# Patient Record
Sex: Female | Born: 1955
Health system: Southern US, Community
[De-identification: ages and names within clinical notes are randomized; demographics above are authoritative.]

## PROBLEM LIST (undated history)

## (undated) HISTORY — PX: TONSILLECTOMY: SUR1361

---
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
## 2002-12-31 ENCOUNTER — Encounter: Admission: RE | Admit: 2002-12-31 | Discharge: 2002-12-31 | Payer: Self-pay | Admitting: Family Medicine

## 2004-05-21 ENCOUNTER — Other Ambulatory Visit: Admission: RE | Admit: 2004-05-21 | Discharge: 2004-05-21 | Payer: Self-pay | Admitting: Family Medicine

## 2005-09-19 ENCOUNTER — Ambulatory Visit: Payer: Self-pay | Admitting: Family Medicine

## 2007-02-19 ENCOUNTER — Ambulatory Visit: Payer: Self-pay | Admitting: Family Medicine

## 2007-04-07 ENCOUNTER — Observation Stay (HOSPITAL_COMMUNITY): Admission: EM | Admit: 2007-04-07 | Discharge: 2007-04-08 | Payer: Self-pay | Admitting: Emergency Medicine

## 2007-04-09 ENCOUNTER — Emergency Department (HOSPITAL_COMMUNITY): Admission: EM | Admit: 2007-04-09 | Discharge: 2007-04-09 | Payer: Self-pay | Admitting: Emergency Medicine

## 2007-09-18 ENCOUNTER — Ambulatory Visit: Payer: Self-pay | Admitting: Family Medicine

## 2008-03-06 ENCOUNTER — Ambulatory Visit: Payer: Self-pay | Admitting: Family Medicine

## 2008-03-11 ENCOUNTER — Other Ambulatory Visit: Admission: RE | Admit: 2008-03-11 | Discharge: 2008-03-11 | Payer: Self-pay | Admitting: Family Medicine

## 2008-03-11 ENCOUNTER — Ambulatory Visit: Payer: Self-pay | Admitting: Family Medicine

## 2008-03-26 ENCOUNTER — Other Ambulatory Visit: Admission: RE | Admit: 2008-03-26 | Discharge: 2008-03-26 | Payer: Self-pay | Admitting: Obstetrics and Gynecology

## 2009-03-26 ENCOUNTER — Ambulatory Visit: Payer: Self-pay | Admitting: Family Medicine

## 2009-05-22 ENCOUNTER — Ambulatory Visit: Payer: Self-pay | Admitting: Family Medicine
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
## 2010-11-19 LAB — BASIC METABOLIC PANEL
BUN: 11
CO2: 25
Calcium: 8.4
Chloride: 102
Creatinine, Ser: 0.61
GFR calc Af Amer: >60
GFR calc non Af Amer: >60
Glucose, Bld: 103 — ABNORMAL HIGH
Potassium: 3.7
Sodium: 133 — ABNORMAL LOW

## 2010-11-19 LAB — CBC
HCT: 27.9 — ABNORMAL LOW
HCT: 28.9 — ABNORMAL LOW
Hemoglobin: 9.2 — ABNORMAL LOW
Hemoglobin: 9.5 — ABNORMAL LOW
MCHC: 32.9
MCHC: 33
MCV: 84.8
MCV: 85.2
Platelets: 165
Platelets: 170
RBC: 3.29 — ABNORMAL LOW
RBC: 3.4 — ABNORMAL LOW
RDW: 13.5
RDW: 13.6
WBC: 6.8
WBC: 7.1

## 2011-09-18 ENCOUNTER — Emergency Department (HOSPITAL_COMMUNITY)
Admission: EM | Admit: 2011-09-18 | Discharge: 2011-09-19 | Disposition: A | Discharge status: Home / Self Care | Payer: BC Managed Care – PPO | Attending: Emergency Medicine | Admitting: Emergency Medicine

## 2011-09-18 DIAGNOSIS — S20219A Contusion of unspecified front wall of thorax, initial encounter: Secondary | ICD-10-CM | POA: Insufficient documentation

## 2011-09-18 DIAGNOSIS — S0990XA Unspecified injury of head, initial encounter: Secondary | ICD-10-CM | POA: Insufficient documentation

## 2011-09-18 DIAGNOSIS — S8000XA Contusion of unspecified knee, initial encounter: Secondary | ICD-10-CM | POA: Insufficient documentation

## 2011-09-18 DIAGNOSIS — Z23 Encounter for immunization: Secondary | ICD-10-CM | POA: Insufficient documentation

## 2011-09-19 ENCOUNTER — Ambulatory Visit (HOSPITAL_COMMUNITY)
Admission: RE | Admit: 2011-09-19 | Discharge: 2011-09-19 | Disposition: A | Discharge status: Home / Self Care | Payer: BC Managed Care – PPO | Source: Ambulatory Visit | Attending: Emergency Medicine | Admitting: Emergency Medicine

## 2011-09-19 ENCOUNTER — Other Ambulatory Visit: Payer: Self-pay | Admitting: Emergency Medicine

## 2011-09-19 ENCOUNTER — Other Ambulatory Visit (HOSPITAL_COMMUNITY): Payer: Self-pay | Admitting: Emergency Medicine

## 2011-09-19 DIAGNOSIS — R52 Pain, unspecified: Secondary | ICD-10-CM | POA: Insufficient documentation

## 2011-09-19 DIAGNOSIS — Z8781 Personal history of (healed) traumatic fracture: Secondary | ICD-10-CM | POA: Insufficient documentation

## 2011-09-19 DIAGNOSIS — M47812 Spondylosis without myelopathy or radiculopathy, cervical region: Secondary | ICD-10-CM | POA: Insufficient documentation

## 2011-09-19 MED FILL — Morphine Sulfate Inj 4 MG/ML: INTRAMUSCULAR | Qty: 1 | Status: AC

## 2011-09-19 MED FILL — Tet Tox-Diph-Acell Pertuss Ad Inj 5-2.5-18.5 LF-LF-MCG/0.5ML: INTRAMUSCULAR | Qty: 0.5 | Status: AC

## 2011-09-19 MED FILL — Ondansetron HCl Inj 4 MG/2ML (2 MG/ML): INTRAMUSCULAR | Qty: 2 | Status: AC

## 2015-08-25 DIAGNOSIS — M25451 Effusion, right hip: Secondary | ICD-10-CM | POA: Diagnosis not present

## 2015-08-25 DIAGNOSIS — M9903 Segmental and somatic dysfunction of lumbar region: Secondary | ICD-10-CM | POA: Diagnosis not present

## 2015-08-25 DIAGNOSIS — M25551 Pain in right hip: Secondary | ICD-10-CM | POA: Diagnosis not present

## 2015-08-27 DIAGNOSIS — M9901 Segmental and somatic dysfunction of cervical region: Secondary | ICD-10-CM | POA: Diagnosis not present

## 2015-08-27 DIAGNOSIS — M25451 Effusion, right hip: Secondary | ICD-10-CM | POA: Diagnosis not present

## 2015-08-27 DIAGNOSIS — M25551 Pain in right hip: Secondary | ICD-10-CM | POA: Diagnosis not present

## 2015-08-27 DIAGNOSIS — M9903 Segmental and somatic dysfunction of lumbar region: Secondary | ICD-10-CM | POA: Diagnosis not present

## 2015-08-28 DIAGNOSIS — M25551 Pain in right hip: Secondary | ICD-10-CM | POA: Diagnosis not present

## 2015-09-17 DIAGNOSIS — M9901 Segmental and somatic dysfunction of cervical region: Secondary | ICD-10-CM | POA: Diagnosis not present

## 2015-09-17 DIAGNOSIS — M7071 Other bursitis of hip, right hip: Secondary | ICD-10-CM | POA: Diagnosis not present

## 2015-09-17 DIAGNOSIS — M9905 Segmental and somatic dysfunction of pelvic region: Secondary | ICD-10-CM | POA: Diagnosis not present

## 2015-09-17 DIAGNOSIS — M9902 Segmental and somatic dysfunction of thoracic region: Secondary | ICD-10-CM | POA: Diagnosis not present

## 2015-09-17 DIAGNOSIS — M62830 Muscle spasm of back: Secondary | ICD-10-CM | POA: Diagnosis not present

## 2015-09-17 DIAGNOSIS — M9903 Segmental and somatic dysfunction of lumbar region: Secondary | ICD-10-CM | POA: Diagnosis not present

## 2015-10-22 DIAGNOSIS — L905 Scar conditions and fibrosis of skin: Secondary | ICD-10-CM | POA: Diagnosis not present

## 2015-10-22 DIAGNOSIS — Z85828 Personal history of other malignant neoplasm of skin: Secondary | ICD-10-CM | POA: Diagnosis not present

## 2015-10-22 DIAGNOSIS — D1801 Hemangioma of skin and subcutaneous tissue: Secondary | ICD-10-CM | POA: Diagnosis not present

## 2015-10-22 DIAGNOSIS — L821 Other seborrheic keratosis: Secondary | ICD-10-CM | POA: Diagnosis not present

## 2015-11-11 DIAGNOSIS — R51 Headache: Secondary | ICD-10-CM | POA: Diagnosis not present

## 2015-11-11 DIAGNOSIS — M9903 Segmental and somatic dysfunction of lumbar region: Secondary | ICD-10-CM | POA: Diagnosis not present

## 2015-11-11 DIAGNOSIS — M9902 Segmental and somatic dysfunction of thoracic region: Secondary | ICD-10-CM | POA: Diagnosis not present

## 2015-11-11 DIAGNOSIS — M9901 Segmental and somatic dysfunction of cervical region: Secondary | ICD-10-CM | POA: Diagnosis not present

## 2015-11-11 DIAGNOSIS — M9905 Segmental and somatic dysfunction of pelvic region: Secondary | ICD-10-CM | POA: Diagnosis not present

## 2015-11-11 DIAGNOSIS — M7071 Other bursitis of hip, right hip: Secondary | ICD-10-CM | POA: Diagnosis not present

## 2015-11-11 DIAGNOSIS — M62830 Muscle spasm of back: Secondary | ICD-10-CM | POA: Diagnosis not present

## 2016-03-03 DIAGNOSIS — F4321 Adjustment disorder with depressed mood: Secondary | ICD-10-CM | POA: Diagnosis not present

## 2016-03-04 DIAGNOSIS — Z1231 Encounter for screening mammogram for malignant neoplasm of breast: Secondary | ICD-10-CM | POA: Diagnosis not present

## 2016-03-29 DIAGNOSIS — L821 Other seborrheic keratosis: Secondary | ICD-10-CM | POA: Diagnosis not present

## 2016-03-29 DIAGNOSIS — L905 Scar conditions and fibrosis of skin: Secondary | ICD-10-CM | POA: Diagnosis not present

## 2016-03-29 DIAGNOSIS — L812 Freckles: Secondary | ICD-10-CM | POA: Diagnosis not present

## 2016-04-20 DIAGNOSIS — Z01810 Encounter for preprocedural cardiovascular examination: Secondary | ICD-10-CM | POA: Diagnosis not present

## 2016-04-20 DIAGNOSIS — Z01812 Encounter for preprocedural laboratory examination: Secondary | ICD-10-CM | POA: Diagnosis not present

## 2016-04-26 DIAGNOSIS — Z1211 Encounter for screening for malignant neoplasm of colon: Secondary | ICD-10-CM | POA: Diagnosis not present

## 2016-04-26 DIAGNOSIS — Z809 Family history of malignant neoplasm, unspecified: Secondary | ICD-10-CM | POA: Diagnosis not present

## 2016-04-28 DIAGNOSIS — R300 Dysuria: Secondary | ICD-10-CM | POA: Diagnosis not present

## 2016-05-23 ENCOUNTER — Encounter: Payer: Self-pay | Admitting: Genetics

## 2016-05-23 ENCOUNTER — Telehealth: Payer: Self-pay | Admitting: Genetics

## 2016-05-23 NOTE — Telephone Encounter (Signed)
Received a call from the referring office. Appt has been scheduled for the pt to see Tobi Bastosnna for genetic counseling on 4/10 at 1pm. The referring office will notify the pt. Letter mailed to the pt.

## 2016-06-07 ENCOUNTER — Ambulatory Visit (HOSPITAL_BASED_OUTPATIENT_CLINIC_OR_DEPARTMENT_OTHER): Payer: Self-pay | Admitting: Genetics

## 2016-06-07 ENCOUNTER — Other Ambulatory Visit: Payer: Self-pay

## 2016-06-07 ENCOUNTER — Encounter: Payer: Self-pay | Admitting: Genetics

## 2016-06-07 DIAGNOSIS — Z85828 Personal history of other malignant neoplasm of skin: Secondary | ICD-10-CM

## 2016-06-07 DIAGNOSIS — Z315 Encounter for genetic counseling: Secondary | ICD-10-CM

## 2016-06-07 DIAGNOSIS — Z808 Family history of malignant neoplasm of other organs or systems: Secondary | ICD-10-CM

## 2016-06-07 DIAGNOSIS — Z809 Family history of malignant neoplasm, unspecified: Secondary | ICD-10-CM

## 2016-06-07 NOTE — Progress Notes (Signed)
REFERRING PROVIDER: Marden Noble, MD [address] [place], [place] [address]  PRIMARY PROVIDER:  Pearla Dubonnet, MD  PRIMARY REASON FOR VISIT:  1. Personal history of skin cancer   2. Family history of cancer      HISTORY OF PRESENT ILLNESS:   Christie Fitzpatrick, a 61 y.o. female, was seen for a [place] cancer genetics consultation at the request of Dr. Kevan Ny due to a personal and family history of cancer.  Christie Fitzpatrick presents to clinic today to discuss the possibility of a hereditary predisposition to cancer, genetic testing, and to further clarify her future cancer risks, as well as potential cancer risks for family members.    Christie Fitzpatrick has a personal history of multiple basal cell carcinomas (BCCs) and one squamous cell carcinoma (SCC). She states that she questioned her referring physician about need for another colonoscopy and it was suggested she see genetic counseling to review genetic testing options. She reports she is concerned about her cancer risk due to her mother's recent passing from metastatic SCC, her father's bile duct cancer, her personal history of skin cancers, and two friends who were recently diagnosed with colorectal cancer.  CANCER HISTORY:   No history exists.   No past medical history on file.  No past surgical history on file.  Social History   Social History  . Marital status: Married    Spouse name: N/A  . Number of children: N/A  . Years of education: N/A   Social History Main Topics  . Smoking status: Not on file  . Smokeless tobacco: Not on file  . Alcohol use Not on file  . Drug use: Unknown  . Sexual activity: Not on file   Other Topics Concern  . Not on file   Social History Narrative  . No narrative on file     FAMILY HISTORY:  We obtained a detailed, 4-generation family history.  Significant diagnoses are listed below: Family History  Problem Relation Age of Onset  . Squamous cell carcinoma Mother      Multiple SCC and BCC throughout life. Metastasis in her early-80s.  . Other Father 48    Bile duct cancer in his early-60s. Metastasized to other parts of GI tract.  . Skin cancer Sister     Multiple SCC and BCC.  . Skin cancer Brother     Multple SCC and BCC.  Marland Kitchen Uterine cancer Maternal Aunt 25    d.25  . Alzheimer's disease Paternal Uncle 74   Christie Fitzpatrick has a daughter, age 59, and a son, age 73, both without cancers. She has a sister, age 53, with a history of multiple BCCs and SCCs. Her brother, age 57, also has a history of multiple BCCs and SCCs. Christie Fitzpatrick reported that her mother had several BCCs and SCCs throughout her life. A SCC recurred and metastasized when her mother was in her early-80s and was the cause of her mother's death. Christie Fitzpatrick father was diagnosed with bile duct cancer in his early-60s. His cancer metastasized to other parts of his GI tract and he died at age 51.  Christie Fitzpatrick had one maternal aunt who died at age 62 from uterine cancer. Her aunt was diagnosed with her cancer during pregnancy and died shortly after delivery. This aunt had 3 children without cancers. Christie Fitzpatrick maternal grandparents both died in their 73s without cancers.  Christie Fitzpatrick had one paternal uncle who died in his late-70s from Alzheimers. He never had  cancer. One of his daughters had a hysterectomy in her 1s due to abnormal cells and possible cancer. She is currently 16 without additional cancers. Christie Fitzpatrick paternal grandmother died in her early-70s from a stroke. Her paternal grandfather died in his late-70s.  Christie Fitzpatrick is unaware of previous family history of genetic testing for hereditary cancer risks. Patient's maternal ancestors are of [place] and [place] descent, and paternal ancestors are of [place] descent. There is no reported Ashkenazi Jewish ancestry. There is no known consanguinity.  GENETIC COUNSELING ASSESSMENT: Christie Fitzpatrick is a 61 y.o. female with a family  history of cancer which is somewhat suggestive of a hereditary cancer syndrome and predisposition to cancer. Though it is unlikely that genetic testing would reveal an explanation for the many skin cancers in her family, Christie Fitzpatrick is a candidate for genetic testing due to her maternal aunt's young age at diagnosis of her uterine cancer. Furthermore, the type of cancer that Christie Fitzpatrick's father had can be associated with Lynch syndrome. However, the rest of her paternal family history is not suggestive of this condition. We, therefore, discussed and recommended the following at today's visit.   DISCUSSION: We reviewed the characteristics, features and inheritance patterns of hereditary cancer syndromes. We also discussed genetic testing, including the appropriate family members to test, the process of testing, insurance coverage and turn-around-time for results. We discussed the implications of a negative, positive and/or variant of uncertain significant result. If Christie Fitzpatrick pursues genetic testing, I recommend either [facility]'s 46-gene Common Hereditary Cancer Panel or [facility]'s 83-gene Multi-Cancer Panel. We discussed the benefits and limitations of testing through large hereditary cancer panels.   Based on Christie Fitzpatrick's family history of cancer, she meets medical criteria for genetic testing. Despite that she meets criteria, she may still have an out of pocket cost. We discussed that if she pursues testing through billing of her health insurance and her out of pocket cost for testing is over $100, the laboratory will call and confirm whether she wants to proceed with testing. If the out of pocket cost of testing is less than $100 she will be billed by the genetic testing laboratory. Alternatively, we discussed Christie Fitzpatrick's option to pursue testing through [facility]'s self-pay option of $250. Christie Fitzpatrick stated that she prefers self-pay.  PLAN: After considering the risks, benefits, and limitations, Ms.  Ellithorpe declined genetic testing today. She would like to think about her options and discuss further with her husband before making a final decision regarding whether she will pursue testing. We understand this decision, and remain available to coordinate genetic testing at any time in the future. Ms. Breed should continue to follow the cancer screening guidelines given by her primary healthcare provider.  Lastly, we encouraged Ms. Fletchall to remain in contact with cancer genetics annually so that we can continuously update the family history and inform her of any changes in cancer genetics and testing that may be of benefit for this family.   Ms.  Crear questions were answered to her satisfaction today. Our contact information was provided should additional questions or concerns arise. Thank you for the referral and allowing [place] to share in the care of your patient.   Rushie Goltz, MS, [facility] Certified Armed forces training and education [email or web address] phone: [phone]  The patient was seen for a total of 35 minutes in face-to-face genetic counseling.    _______________________________________________________________________ For Office Staff:  Number of people involved in session: 1 Was an Intern/ student involved with case: no

## 2016-07-06 DIAGNOSIS — Z6820 Body mass index (BMI) 20.0-20.9, adult: Secondary | ICD-10-CM | POA: Diagnosis not present

## 2016-07-06 DIAGNOSIS — Z130 Encounter for screening for diseases of the blood and blood-forming organs and certain disorders involving the immune mechanism: Secondary | ICD-10-CM | POA: Diagnosis not present

## 2016-07-06 DIAGNOSIS — N951 Menopausal and female climacteric states: Secondary | ICD-10-CM | POA: Diagnosis not present

## 2016-07-06 DIAGNOSIS — Z1151 Encounter for screening for human papillomavirus (HPV): Secondary | ICD-10-CM | POA: Diagnosis not present

## 2016-07-06 DIAGNOSIS — Z124 Encounter for screening for malignant neoplasm of cervix: Secondary | ICD-10-CM | POA: Diagnosis not present

## 2016-07-06 DIAGNOSIS — Z01419 Encounter for gynecological examination (general) (routine) without abnormal findings: Secondary | ICD-10-CM | POA: Diagnosis not present

## 2016-07-06 DIAGNOSIS — Z1389 Encounter for screening for other disorder: Secondary | ICD-10-CM | POA: Diagnosis not present

## 2016-07-12 DIAGNOSIS — M25551 Pain in right hip: Secondary | ICD-10-CM | POA: Diagnosis not present

## 2016-08-10 DIAGNOSIS — M25551 Pain in right hip: Secondary | ICD-10-CM | POA: Diagnosis not present

## 2016-10-26 DIAGNOSIS — D225 Melanocytic nevi of trunk: Secondary | ICD-10-CM | POA: Diagnosis not present

## 2016-10-26 DIAGNOSIS — L905 Scar conditions and fibrosis of skin: Secondary | ICD-10-CM | POA: Diagnosis not present

## 2016-10-26 DIAGNOSIS — L814 Other melanin hyperpigmentation: Secondary | ICD-10-CM | POA: Diagnosis not present

## 2016-10-26 DIAGNOSIS — C44612 Basal cell carcinoma of skin of right upper limb, including shoulder: Secondary | ICD-10-CM | POA: Diagnosis not present

## 2016-10-26 DIAGNOSIS — Z85828 Personal history of other malignant neoplasm of skin: Secondary | ICD-10-CM | POA: Diagnosis not present

## 2016-10-26 DIAGNOSIS — D2271 Melanocytic nevi of right lower limb, including hip: Secondary | ICD-10-CM | POA: Diagnosis not present

## 2016-10-26 DIAGNOSIS — D485 Neoplasm of uncertain behavior of skin: Secondary | ICD-10-CM | POA: Diagnosis not present

## 2016-12-05 DIAGNOSIS — M8589 Other specified disorders of bone density and structure, multiple sites: Secondary | ICD-10-CM | POA: Diagnosis not present

## 2017-04-21 DIAGNOSIS — M25551 Pain in right hip: Secondary | ICD-10-CM | POA: Diagnosis not present

## 2017-04-21 DIAGNOSIS — M461 Sacroiliitis, not elsewhere classified: Secondary | ICD-10-CM | POA: Diagnosis not present

## 2017-04-21 DIAGNOSIS — M533 Sacrococcygeal disorders, not elsewhere classified: Secondary | ICD-10-CM | POA: Diagnosis not present

## 2017-05-10 DIAGNOSIS — M533 Sacrococcygeal disorders, not elsewhere classified: Secondary | ICD-10-CM | POA: Diagnosis not present

## 2017-09-05 DIAGNOSIS — D1801 Hemangioma of skin and subcutaneous tissue: Secondary | ICD-10-CM | POA: Diagnosis not present

## 2017-09-05 DIAGNOSIS — L570 Actinic keratosis: Secondary | ICD-10-CM | POA: Diagnosis not present

## 2017-09-05 DIAGNOSIS — L814 Other melanin hyperpigmentation: Secondary | ICD-10-CM | POA: Diagnosis not present

## 2017-09-05 DIAGNOSIS — L821 Other seborrheic keratosis: Secondary | ICD-10-CM | POA: Diagnosis not present

## 2018-01-30 DIAGNOSIS — J069 Acute upper respiratory infection, unspecified: Secondary | ICD-10-CM | POA: Diagnosis not present

## 2018-04-03 DIAGNOSIS — D1801 Hemangioma of skin and subcutaneous tissue: Secondary | ICD-10-CM | POA: Diagnosis not present

## 2018-04-03 DIAGNOSIS — R208 Other disturbances of skin sensation: Secondary | ICD-10-CM | POA: Diagnosis not present

## 2018-04-03 DIAGNOSIS — D229 Melanocytic nevi, unspecified: Secondary | ICD-10-CM | POA: Diagnosis not present

## 2018-04-03 DIAGNOSIS — L570 Actinic keratosis: Secondary | ICD-10-CM | POA: Diagnosis not present

## 2018-04-03 DIAGNOSIS — B070 Plantar wart: Secondary | ICD-10-CM | POA: Diagnosis not present

## 2018-04-03 DIAGNOSIS — L821 Other seborrheic keratosis: Secondary | ICD-10-CM | POA: Diagnosis not present

## 2018-04-03 DIAGNOSIS — L814 Other melanin hyperpigmentation: Secondary | ICD-10-CM | POA: Diagnosis not present

## 2018-04-19 DIAGNOSIS — Z1231 Encounter for screening mammogram for malignant neoplasm of breast: Secondary | ICD-10-CM | POA: Diagnosis not present

## 2018-04-19 DIAGNOSIS — Z01419 Encounter for gynecological examination (general) (routine) without abnormal findings: Secondary | ICD-10-CM | POA: Diagnosis not present

## 2018-04-19 DIAGNOSIS — Z1389 Encounter for screening for other disorder: Secondary | ICD-10-CM | POA: Diagnosis not present

## 2018-04-19 DIAGNOSIS — Z6820 Body mass index (BMI) 20.0-20.9, adult: Secondary | ICD-10-CM | POA: Diagnosis not present

## 2018-04-19 DIAGNOSIS — Z124 Encounter for screening for malignant neoplasm of cervix: Secondary | ICD-10-CM | POA: Diagnosis not present

## 2018-05-30 DIAGNOSIS — K625 Hemorrhage of anus and rectum: Secondary | ICD-10-CM | POA: Diagnosis not present

## 2018-08-20 DIAGNOSIS — L821 Other seborrheic keratosis: Secondary | ICD-10-CM | POA: Diagnosis not present

## 2018-08-20 DIAGNOSIS — D1801 Hemangioma of skin and subcutaneous tissue: Secondary | ICD-10-CM | POA: Diagnosis not present

## 2018-08-20 DIAGNOSIS — D229 Melanocytic nevi, unspecified: Secondary | ICD-10-CM | POA: Diagnosis not present

## 2018-08-20 DIAGNOSIS — B078 Other viral warts: Secondary | ICD-10-CM | POA: Diagnosis not present

## 2018-08-20 DIAGNOSIS — L814 Other melanin hyperpigmentation: Secondary | ICD-10-CM | POA: Diagnosis not present

## 2018-12-20 DIAGNOSIS — D225 Melanocytic nevi of trunk: Secondary | ICD-10-CM | POA: Diagnosis not present

## 2018-12-20 DIAGNOSIS — D485 Neoplasm of uncertain behavior of skin: Secondary | ICD-10-CM | POA: Diagnosis not present

## 2018-12-20 DIAGNOSIS — Z23 Encounter for immunization: Secondary | ICD-10-CM | POA: Diagnosis not present

## 2018-12-20 DIAGNOSIS — Z85828 Personal history of other malignant neoplasm of skin: Secondary | ICD-10-CM | POA: Diagnosis not present

## 2018-12-20 DIAGNOSIS — L570 Actinic keratosis: Secondary | ICD-10-CM | POA: Diagnosis not present

## 2019-01-22 DIAGNOSIS — F320 Major depressive disorder, single episode, mild: Secondary | ICD-10-CM | POA: Diagnosis not present

## 2019-01-22 DIAGNOSIS — Z23 Encounter for immunization: Secondary | ICD-10-CM | POA: Diagnosis not present

## 2019-01-22 DIAGNOSIS — F411 Generalized anxiety disorder: Secondary | ICD-10-CM | POA: Diagnosis not present

## 2019-02-11 ENCOUNTER — Other Ambulatory Visit: Payer: Self-pay | Admitting: Internal Medicine

## 2019-02-11 DIAGNOSIS — K30 Functional dyspepsia: Secondary | ICD-10-CM | POA: Diagnosis not present

## 2019-02-13 ENCOUNTER — Ambulatory Visit
Admission: RE | Admit: 2019-02-13 | Discharge: 2019-02-13 | Disposition: A | Discharge status: Home / Self Care | Payer: BC Managed Care – PPO | Source: Ambulatory Visit | Attending: Internal Medicine | Admitting: Internal Medicine

## 2019-02-13 DIAGNOSIS — K30 Functional dyspepsia: Secondary | ICD-10-CM

## 2019-03-14 DIAGNOSIS — K30 Functional dyspepsia: Secondary | ICD-10-CM | POA: Diagnosis not present

## 2019-05-08 DIAGNOSIS — Z1159 Encounter for screening for other viral diseases: Secondary | ICD-10-CM | POA: Diagnosis not present

## 2019-05-13 DIAGNOSIS — D123 Benign neoplasm of transverse colon: Secondary | ICD-10-CM | POA: Diagnosis not present

## 2019-05-13 DIAGNOSIS — Z1211 Encounter for screening for malignant neoplasm of colon: Secondary | ICD-10-CM | POA: Diagnosis not present

## 2019-05-13 DIAGNOSIS — K635 Polyp of colon: Secondary | ICD-10-CM | POA: Diagnosis not present

## 2019-05-13 DIAGNOSIS — D122 Benign neoplasm of ascending colon: Secondary | ICD-10-CM | POA: Diagnosis not present

## 2019-05-13 DIAGNOSIS — D128 Benign neoplasm of rectum: Secondary | ICD-10-CM | POA: Diagnosis not present

## 2019-05-13 DIAGNOSIS — K648 Other hemorrhoids: Secondary | ICD-10-CM | POA: Diagnosis not present

## 2019-06-20 DIAGNOSIS — L309 Dermatitis, unspecified: Secondary | ICD-10-CM | POA: Diagnosis not present

## 2019-06-20 DIAGNOSIS — D2271 Melanocytic nevi of right lower limb, including hip: Secondary | ICD-10-CM | POA: Diagnosis not present

## 2019-06-20 DIAGNOSIS — L570 Actinic keratosis: Secondary | ICD-10-CM | POA: Diagnosis not present

## 2019-06-20 DIAGNOSIS — D225 Melanocytic nevi of trunk: Secondary | ICD-10-CM | POA: Diagnosis not present

## 2019-06-20 DIAGNOSIS — B078 Other viral warts: Secondary | ICD-10-CM | POA: Diagnosis not present

## 2019-06-20 DIAGNOSIS — Z85828 Personal history of other malignant neoplasm of skin: Secondary | ICD-10-CM | POA: Diagnosis not present

## 2019-09-19 DIAGNOSIS — R300 Dysuria: Secondary | ICD-10-CM | POA: Diagnosis not present

## 2019-12-27 DIAGNOSIS — R059 Cough, unspecified: Secondary | ICD-10-CM | POA: Diagnosis not present

## 2019-12-27 DIAGNOSIS — J029 Acute pharyngitis, unspecified: Secondary | ICD-10-CM | POA: Diagnosis not present

## 2020-02-13 DIAGNOSIS — L820 Inflamed seborrheic keratosis: Secondary | ICD-10-CM | POA: Diagnosis not present

## 2020-02-13 DIAGNOSIS — L570 Actinic keratosis: Secondary | ICD-10-CM | POA: Diagnosis not present

## 2020-02-13 DIAGNOSIS — L111 Transient acantholytic dermatosis [Grover]: Secondary | ICD-10-CM | POA: Diagnosis not present

## 2020-02-13 DIAGNOSIS — Z85828 Personal history of other malignant neoplasm of skin: Secondary | ICD-10-CM | POA: Diagnosis not present

## 2020-02-13 DIAGNOSIS — Z86007 Personal history of in-situ neoplasm of skin: Secondary | ICD-10-CM | POA: Diagnosis not present

## 2020-02-13 DIAGNOSIS — L578 Other skin changes due to chronic exposure to nonionizing radiation: Secondary | ICD-10-CM | POA: Diagnosis not present

## 2020-03-30 DIAGNOSIS — Z20822 Contact with and (suspected) exposure to covid-19: Secondary | ICD-10-CM | POA: Diagnosis not present

## 2020-03-31 DIAGNOSIS — Z20822 Contact with and (suspected) exposure to covid-19: Secondary | ICD-10-CM | POA: Diagnosis not present

## 2020-03-31 DIAGNOSIS — Z03818 Encounter for observation for suspected exposure to other biological agents ruled out: Secondary | ICD-10-CM | POA: Diagnosis not present

## 2020-07-03 DIAGNOSIS — M25551 Pain in right hip: Secondary | ICD-10-CM | POA: Diagnosis not present

## 2020-10-22 DIAGNOSIS — Z85828 Personal history of other malignant neoplasm of skin: Secondary | ICD-10-CM | POA: Diagnosis not present

## 2020-10-22 DIAGNOSIS — L578 Other skin changes due to chronic exposure to nonionizing radiation: Secondary | ICD-10-CM | POA: Diagnosis not present

## 2020-10-22 DIAGNOSIS — L570 Actinic keratosis: Secondary | ICD-10-CM | POA: Diagnosis not present

## 2020-10-22 DIAGNOSIS — D485 Neoplasm of uncertain behavior of skin: Secondary | ICD-10-CM | POA: Diagnosis not present

## 2020-10-22 DIAGNOSIS — L821 Other seborrheic keratosis: Secondary | ICD-10-CM | POA: Diagnosis not present

## 2020-10-22 DIAGNOSIS — L814 Other melanin hyperpigmentation: Secondary | ICD-10-CM | POA: Diagnosis not present

## 2020-10-22 DIAGNOSIS — D2371 Other benign neoplasm of skin of right lower limb, including hip: Secondary | ICD-10-CM | POA: Diagnosis not present

## 2020-11-10 DIAGNOSIS — Z124 Encounter for screening for malignant neoplasm of cervix: Secondary | ICD-10-CM | POA: Diagnosis not present

## 2020-11-10 DIAGNOSIS — Z1231 Encounter for screening mammogram for malignant neoplasm of breast: Secondary | ICD-10-CM | POA: Diagnosis not present

## 2020-11-10 DIAGNOSIS — Z6821 Body mass index (BMI) 21.0-21.9, adult: Secondary | ICD-10-CM | POA: Diagnosis not present

## 2020-11-10 DIAGNOSIS — Z1151 Encounter for screening for human papillomavirus (HPV): Secondary | ICD-10-CM | POA: Diagnosis not present

## 2020-11-10 DIAGNOSIS — Z01419 Encounter for gynecological examination (general) (routine) without abnormal findings: Secondary | ICD-10-CM | POA: Diagnosis not present

## 2020-11-17 ENCOUNTER — Encounter: Payer: Self-pay | Admitting: Sports Medicine

## 2020-11-17 ENCOUNTER — Ambulatory Visit: Payer: BC Managed Care – PPO | Admitting: Sports Medicine

## 2020-11-17 ENCOUNTER — Other Ambulatory Visit: Payer: Self-pay

## 2020-11-17 ENCOUNTER — Ambulatory Visit
Admission: RE | Admit: 2020-11-17 | Discharge: 2020-11-17 | Disposition: A | Discharge status: Home / Self Care | Payer: BC Managed Care – PPO | Source: Ambulatory Visit | Attending: Sports Medicine | Admitting: Sports Medicine

## 2020-11-17 VITALS — Ht 67.0 in | Wt 135.0 lb

## 2020-11-17 DIAGNOSIS — G8929 Other chronic pain: Secondary | ICD-10-CM | POA: Diagnosis not present

## 2020-11-17 DIAGNOSIS — M7660 Achilles tendinitis, unspecified leg: Secondary | ICD-10-CM

## 2020-11-17 DIAGNOSIS — M25551 Pain in right hip: Secondary | ICD-10-CM

## 2020-11-17 NOTE — Assessment & Plan Note (Signed)
Will provide home exercises today for this Advised patient to use heel lifting devices to orthotics to help decrease tension on the tendon

## 2020-11-17 NOTE — Assessment & Plan Note (Addendum)
Broad differential for hip pain, considered greater trochanter pain but less likely given minimal response with treatment in the past, hip osteoarthritis is likely given worsening pain over time, highly likely that patient is experiencing true hip joint pain given history and time course of symptoms, overall patient's pain worsening - start by obtaining right hip xrays  -Will follow up with results once available  -held lengthy discussion today about potential plan for MRI if hip xray does not reveal much in regards to diagnosis, patient is in agreement with plan

## 2020-11-17 NOTE — Progress Notes (Addendum)
PCP: Marden Noble, MD  Subjective:   HPI: Patient is a 65 y.o. female here for right hip pain.  Patient reports that she has been experiencing right hip pain for decades.  She reports that it all started when she was pregnant with her daughter who is now 67 and with chiropractic therapy and exercises she was able to get relief from the pain for several years.  In the last 20 years, she has seen several therapists had massages and seen orthopedic physicians and pain just continues to worsen on the right.  She localizes pain to her right superior gluteal region with pain that extends into the anterior groin and down her anterior thigh on the right.  She reports that she has pain with ambulation and is not able to ambulate normally.  She reports that she sometimes has pain during the night.  Patient states that she has not had x-rays of her hip in several years (Dr. Jerl Santos).  She states that she has been taking NSAIDs but is beginning to have GI upset associated with this and normally does not take any medication.  Patient states that she previously participated in a swimming, was an Mudlogger and avid runner but is unable to do any of these activities due to pain in her hip.  She was previously diagnosed with bursitis of the greater trochanter but had little improvement of pain following treatment with an US guided injection done by Dr Lucie Leather.        Objective:  Physical Exam:  Gen: awake, alert, NAD, comfortable in exam room Pulm: breathing unlabored  Hip:  - Inspection: No gross deformity - Palpation: TTP at the lateral hip - ROM: External rotation of right hip is limited, normal range of motion on Flexion, extension, abduction, internal rotation - Strength: Decreased strength bilaterally of both hips with abduction - Neuro/vasc: NV intact distally - Special Tests: Negative FABER and FADIR.  Positive Trendelenberg .  Nodule on achilles tendon, left   Assessment &  Plan:    Chronic right hip pain Broad differential for hip pain, considered greater trochanter pain but less likely given minimal response with treatment in the past, hip osteoarthritis is likely given worsening pain over time, highly likely that patient is experiencing true hip joint pain given history and time course of symptoms, overall patient's pain worsening - start by obtaining right hip xrays  -Will follow up with results once available  -held lengthy discussion today about potential plan for MRI if hip xray does not reveal much in regards to diagnosis, patient is in agreement with plan    Non-insertional Achilles tendinopathy Will provide home exercises today for this Advised patient to use heel lifting devices to orthotics to help decrease tension on the tendon   Orders Placed This Encounter  Procedures   DG HIP UNILAT WITH PELVIS 2-3 VIEWS RIGHT    Standing Status:   Future    Standing Expiration Date:   11/17/2021    Order Specific Question:   Reason for Exam (SYMPTOM  OR DIAGNOSIS REQUIRED)    Answer:   AP and lateral views    Order Specific Question:   Preferred imaging location?    Answer:   [facility]    No orders of the defined types were placed in this encounter.   Ronnald Ramp, MD [facility], PGY-3 11/17/2020 12:24 PM  Patient seen and evaluated with the resident.  I agree with the above plan of care.  X-rays are unremarkable.  Christie Fitzpatrick has had chronic pain in this hip for many years.  She has failed exhaustive conservative treatment including  physical therapy and an ultrasound-guided cortisone injection for greater trochanteric bursitis.  I would like to proceed with an MRI specifically to rule out tendon tear or intra-articular hip pathology such as a labral tear.  Phone follow-up with those results when available we will delineate further treatment based on those findings.  This note was dictated using Dragon naturally speaking  software and may contain errors in syntax, spelling, or content which have not been identified prior to signing this note.

## 2020-11-17 NOTE — Patient Instructions (Signed)
Please go to [address] to [facility] for your xray of your hip.   We will call to discuss results and follow up plan afterwards.

## 2020-11-18 ENCOUNTER — Other Ambulatory Visit: Payer: Self-pay

## 2020-11-18 DIAGNOSIS — M25551 Pain in right hip: Secondary | ICD-10-CM

## 2020-11-27 DIAGNOSIS — M8568 Other cyst of bone, other site: Secondary | ICD-10-CM | POA: Diagnosis not present

## 2020-11-27 DIAGNOSIS — M958 Other specified acquired deformities of musculoskeletal system: Secondary | ICD-10-CM | POA: Diagnosis not present

## 2020-11-27 DIAGNOSIS — M7061 Trochanteric bursitis, right hip: Secondary | ICD-10-CM | POA: Diagnosis not present

## 2020-11-27 DIAGNOSIS — M1611 Unilateral primary osteoarthritis, right hip: Secondary | ICD-10-CM | POA: Diagnosis not present

## 2020-11-28 ENCOUNTER — Other Ambulatory Visit: Payer: BC Managed Care – PPO

## 2020-12-03 ENCOUNTER — Other Ambulatory Visit: Payer: Self-pay | Admitting: *Deleted

## 2020-12-03 ENCOUNTER — Telehealth: Payer: Self-pay | Admitting: Sports Medicine

## 2020-12-03 ENCOUNTER — Encounter: Payer: Self-pay | Admitting: Sports Medicine

## 2020-12-03 DIAGNOSIS — M25551 Pain in right hip: Secondary | ICD-10-CM

## 2020-12-03 NOTE — Telephone Encounter (Signed)
  I spoke with Christie Fitzpatrick on the phone today after reviewing the MRI of her right hip done a few days ago.  She has findings consistent with moderate right hip DJD.  This includes an osteochondral abnormality and subchondral cyst formation as well as a degenerative labral tear.  This is in the setting of chronic ischiofemoral impingement.  I recommend surgical consultation to discuss further treatment.  I am not sure if she is a candidate for a hip arthroscopy or if her only surgical option would be a total hip arthroplasty.  She personally knows Dr. Jerl Santos and she would like to discuss these findings with him first.  I also mentioned that the radiologist sees a low intensity lesion in the pelvis adjacent to the uterus for which she recommends a pelvic ultrasound to evaluate further.  We will order the study and I will call [place] with those results once available.

## 2020-12-04 ENCOUNTER — Other Ambulatory Visit: Payer: Self-pay | Admitting: *Deleted

## 2020-12-04 DIAGNOSIS — M25551 Pain in right hip: Secondary | ICD-10-CM

## 2020-12-04 NOTE — Progress Notes (Signed)
New order place to [facility] to do the ultrasound

## 2020-12-09 ENCOUNTER — Other Ambulatory Visit: Payer: Self-pay

## 2020-12-09 ENCOUNTER — Ambulatory Visit: Payer: BC Managed Care – PPO | Admitting: Orthopaedic Surgery

## 2020-12-09 ENCOUNTER — Encounter: Payer: Self-pay | Admitting: Orthopaedic Surgery

## 2020-12-09 DIAGNOSIS — M1611 Unilateral primary osteoarthritis, right hip: Secondary | ICD-10-CM | POA: Diagnosis not present

## 2020-12-09 NOTE — Progress Notes (Signed)
Office Visit Note   Patient: Christie Fitzpatrick           Date of Birth: 03/05/1955           MRN: [id number] Visit Date: 12/09/2020              Requested by: Marden Noble, MD [address] [place],  [place] [address] PCP: Marden Noble, MD   Assessment & Plan: Visit Diagnoses:  1. Unilateral primary osteoarthritis, right hip     Plan: Unfortunately the patient signs and symptoms correlate with right hip osteoarthritis.  At some point she may end up benefiting from a hip replacement when her hip pain becomes daily and definitely affects her mobility, her quality of life and actives daily living.  Her and her husband are leaving for a trip to [place] 13 March 2021.  We will see if we can set her up for an intra-articular steroid injection by Dr. Alvester Morin under fluoroscopy January 9 or 10 which could certainly help her for that trip.  I did give her handout about hip replacement surgery and we can talk about that at a later date once things worsen for her.  All questions and concerns were answered and addressed.  Follow-Up Instructions: Return if symptoms worsen or fail to improve.   Orders:  No orders of the defined types were placed in this encounter.  No orders of the defined types were placed in this encounter.     Procedures: No procedures performed   Clinical Data: No additional findings.   Subjective: Chief Complaint  Patient presents with   Right Hip - Follow-up  The patient is a very pleasant 65 year old active female who comes in with worsening right hip pain.  This is been going on for several years and she does have times where the hip catches and locks on her.  It is all around the groin area in the posterior hip as a source of her pain.  She has plain films and even a MRI of her right hip that correlates with areas of full-thickness cartilage loss on the femoral head and acetabulum of the weightbearing aspect of the hip.  There is also degenerative  labral tear.  She has had at least 1 intra-articular injection sometime ago which helped greatly.  She later had another injection but she said that was more around the bursa and that did not help her at all.  She does perform yoga.  She used to be a runner.  She is a very thin and petite individual.  She has some good days and some bad days.  Occasionally does wake her up at night.  She is not a diabetic either.  She has no significant active medical issues at all.  HPI  Review of Systems There is currently listed no headache, chest pain, shortness of breath, fever, chills, nausea, vomiting  Objective: Vital Signs: There were no vitals taken for this visit.  Physical Exam She is alert and orient x3 and in no acute distress Ortho Exam Examination of her right hip shows it moves smoothly and fluidly with just a touch of stiffness but no pain in the groin today on exam.  Her left hip is normal exam. Specialty Comments:  No specialty comments available.  Imaging: No results found. The plain films were reviewed with her of her right hip and show well-maintained joint space.  Unfortunately MRI does show areas of full-thickness cartilage loss in the femoral head and  acetabulum as well as subchondral cystic changes in the acetabulum and femoral head that correlates with arthritis.  There is a degenerative labral tear as well.  PMFS History: Patient Active Problem List   Diagnosis Date Noted   Chronic right hip pain 11/17/2020   Non-insertional Achilles tendinopathy 11/17/2020   History reviewed. No pertinent past medical history.  Family History  Problem Relation Age of Onset   Squamous cell carcinoma Mother        Multiple SCC and BCC throughout life. Metastasis in her early-80s.   Other Father 41       Bile duct cancer in his early-60s. Metastasized to other parts of GI tract.   Skin cancer Sister        Multiple SCC and BCC.   Skin cancer Brother        Multple SCC and BCC.    Uterine cancer Maternal Aunt 25       d.25   Alzheimer's disease Paternal Uncle 82    History reviewed. No pertinent surgical history. Social History   Occupational History   Not on file  Tobacco Use   Smoking status: Not on file   Smokeless tobacco: Not on file  Substance and Sexual Activity   Alcohol use: Not on file   Drug use: Not on file   Sexual activity: Not on file

## 2020-12-10 DIAGNOSIS — R9389 Abnormal findings on diagnostic imaging of other specified body structures: Secondary | ICD-10-CM | POA: Diagnosis not present

## 2020-12-14 ENCOUNTER — Encounter: Payer: Self-pay | Admitting: Sports Medicine

## 2020-12-14 ENCOUNTER — Telehealth: Payer: Self-pay | Admitting: Sports Medicine

## 2020-12-14 NOTE — Telephone Encounter (Signed)
  I spoke with Christie Fitzpatrick on the phone today after reviewing the ultrasound of her pelvis which was suggested by radiology after a lesion was seen on a previous MRI.  The ultrasound suggest that the lesion seen may represent a right lower uterine segment fibroid for which they recommend a follow-up ultrasound in 6 months.  I will send a copy of this report to [facility] gynecologist Dr. Ellyn Hack  just to ensure that she agrees with this recommendation.  Chinita will follow-up with me as needed.

## 2020-12-17 ENCOUNTER — Encounter: Payer: Self-pay | Admitting: Sports Medicine

## 2021-02-26 ENCOUNTER — Telehealth: Payer: Self-pay | Admitting: Orthopaedic Surgery

## 2021-02-26 NOTE — Telephone Encounter (Signed)
Pt called stating she had a MRI of her hip and discussed having a possible surgery; pt would like a CB to be updated on when she can get on the surgery schedule.    [phone]

## 2021-03-02 NOTE — Telephone Encounter (Signed)
Does patient need to be seen again?  Please advise how to proceed.  Thanks!

## 2021-03-03 NOTE — Telephone Encounter (Signed)
I called patient and made appointment with Dr. Leanord Hawking.  Will talk to patient after she sees Dr. Magnus Ivan about scheduling surgery.

## 2021-03-08 ENCOUNTER — Ambulatory Visit (INDEPENDENT_AMBULATORY_CARE_PROVIDER_SITE_OTHER): Payer: BC Managed Care – PPO | Admitting: Physical Medicine and Rehabilitation

## 2021-03-08 ENCOUNTER — Encounter: Payer: Self-pay | Admitting: Physical Medicine and Rehabilitation

## 2021-03-08 ENCOUNTER — Other Ambulatory Visit: Payer: Self-pay

## 2021-03-08 ENCOUNTER — Ambulatory Visit: Payer: Self-pay

## 2021-03-08 DIAGNOSIS — M25551 Pain in right hip: Secondary | ICD-10-CM

## 2021-03-08 MED ORDER — BUPIVACAINE HCL 0.25 % IJ SOLN
4.0000 mL | INTRAMUSCULAR | Status: AC | PRN
  Administered 2021-03-08: 4 mL via INTRA_ARTICULAR

## 2021-03-08 MED ORDER — TRIAMCINOLONE ACETONIDE 40 MG/ML IJ SUSP
60.0000 mg | INTRAMUSCULAR | Status: AC | PRN
  Administered 2021-03-08: 60 mg via INTRA_ARTICULAR

## 2021-03-08 NOTE — Progress Notes (Signed)
Christie Fitzpatrick - 66 y.o. female MRN [id number]  Date of birth: October 28, 1955  Office Visit Note: Visit Date: 03/08/2021 PCP: Marden Noble, MD Referred by: Marden Noble, MD  Subjective: Chief Complaint  Patient presents with   Right Hip - Pain   HPI:  Christie Fitzpatrick is a 66 y.o. female who comes in today at the request of Dr. Doneen Poisson for planned Right anesthetic hip arthrogram with fluoroscopic guidance.  The patient has failed conservative care including home exercise, medications, time and activity modification.  This injection will be diagnostic and hopefully therapeutic.  Please see requesting physician notes for further details and justification. MRI reviewed.  ROS Otherwise per HPI.  Assessment & Plan: Visit Diagnoses:    ICD-10-CM   1. Pain in right hip  M25.551 XR C-ARM NO REPORT      Plan: No additional findings.   Meds & Orders: No orders of the defined types were placed in this encounter.   Orders Placed This Encounter  Procedures   Large Joint Inj   XR C-ARM NO REPORT    Follow-up: Return for Doneen Poisson, MD as scheduled.   Procedures: Large Joint Inj: R hip joint on 03/08/2021 1:42 PM Indications: diagnostic evaluation and pain Details: 22 G 3.5 in needle, fluoroscopy-guided anterior approach  Arthrogram: No  Medications: 4 mL bupivacaine 0.25 %; 60 mg triamcinolone acetonide 40 MG/ML Outcome: tolerated well, no immediate complications  There was excellent flow of contrast producing a partial arthrogram of the hip. She reported typical referral patter with injection of medication. Unfortunately, she still had posterior hip pain after injection. Consider alternative pain generator. Procedure, treatment alternatives, risks and benefits explained, specific risks discussed. Consent was given by the patient. Immediately prior to procedure a time out was called to verify the correct patient, procedure, equipment, support staff and site/side marked  as required. Patient was prepped and draped in the usual sterile fashion.         Clinical History: TECHNIQUE: MRI HIP RIGHT WO IV CONTRAST Multiplanar, multisequence hip MRI was performed without contrast. This protocol includes high resolution images of the symptomatic hip and large field-of-view images of the pelvis, including both hips.   FINDINGS:     FOCUSED FIELD OF VIEWS OF RIGHT HIP:  #  Bone: No acute fracture. No suspicious bony lesion. Subchondral cystic changes in the acetabulum.  #  Joint space: No effusion. No malalignment. Subchondral cysts in the superior acetabulum and anterior acetabulum. Areas of high-grade and full-thickness cartilage loss in the weightbearing/posterior weightbearing femoral head and chest. Spurring along the acetabulum. Mild spurring along the femoral head/neck junction. There is some preservation cartilage anteriorly and posteriorly.  #  Labrum: Evaluation limited by lack of intra-articular fluid/contrast. Linear signal along the posterosuperior labrum is concerning for tear (series 12, image 8; series 14, image 4). Fraying/degenerative tear of the anterior/superior labrum.   ADDITIONAL OSSEOUS STRUCTURES/JOINTS:  #  No acute fracture.  #  Mild left hip degenerative changes.  #  Mild pubic symphysis degenerative changes.  #  Mild sacroiliac joint degenerative changes.  #  No avascular necrosis.  #  No hip joint effusions.  #  Tarlov cysts in the right measuring up to 2.3 cm.   MUSCLES/TENDONS:  #  Gluteus muscles/tendons: Mild gluteus minimus/medius peritendinitis.  #  Common hamstring origins: Intact.  #  Iliopsoas tendon: Intact.   OTHER SOFT TISSUES:    #  Mild bilateral greater trochanteric bursitis.  #  Narrowing of the  right ischiofemoral space with edema.  #  Sciatic nerve is unremarkable.   VISUALIZED PELVIC VISCERA:  #  Low intensity structure in the pelvis adjacent to the uterus measuring 3.4 x 2.7 cm (series 5, image 11). This is  only seen on one sequence and is nonspecific.   VISUALIZED SPINE:  #  Partially imaged degenerative changes of the lumbar spine.    IMPRESSION:  1.  No acute fracture or malalignment.  2.  Moderate right hip degenerative changes with osteochondral abnormality and subchondral cyst formation.  3.  Findings suggestive of right ischiofemoral impingement.  4.  Mild bilateral greater trochanteric bursitis.  5.  Mild peritendinitis of the insertional right gluteus minimus/medius.  6.  Low intensity lesion in the pelvis adjacent uterus. This is nonspecific and not well evaluated. Recommend pelvic ultrasound for further evaluation.     Electronically Signed by: Maurene Capes on 11/30/2020 11:25 AM     Objective:  VS:  HT:     WT:    BMI:      BP:    HR: bpm   TEMP: ( )   RESP:  Physical Exam   Imaging: No results found.

## 2021-03-08 NOTE — Progress Notes (Signed)
Pt state right hip pain. Pt state any movement makes the worse. Pt state she takes over the counter pain meds to help ease her pain.  Numeric Pain Rating Scale and Functional Assessment Average Pain 7   In the last MONTH (on 0-10 scale) has pain interfered with the following?  1. General activity like being  able to carry out your everyday physical activities such as walking, climbing stairs, carrying groceries, or moving a chair?  Rating(10)   -BT, -Dye Allergies.

## 2021-03-10 ENCOUNTER — Other Ambulatory Visit: Payer: Self-pay

## 2021-03-10 ENCOUNTER — Encounter: Payer: Self-pay | Admitting: Orthopaedic Surgery

## 2021-03-10 ENCOUNTER — Ambulatory Visit (INDEPENDENT_AMBULATORY_CARE_PROVIDER_SITE_OTHER): Payer: Self-pay | Admitting: Orthopaedic Surgery

## 2021-03-10 DIAGNOSIS — M1611 Unilateral primary osteoarthritis, right hip: Secondary | ICD-10-CM

## 2021-03-10 NOTE — Progress Notes (Signed)
The patient is a 66 year old female well-known to me.  She unfortunate has significant arthritis of her right hip with labral tearing and cartilage changes at the weightbearing surface of her hip.  She did have an intra-articular injection of a steroid under fluoroscopy in that right hip 2 days ago.  She is traveling out of the country at the end of this week.  We have already talked in length in detail about hip replacement surgery.  She has more appropriate questions today again as relates to her hip replacement.  We talked about intraoperative and postoperative course.  We discussed the risk and benefits of surgery and what to expect in the short run and long run.  I have advised that she take a full-strength aspirin around her trip and wear compressive hose since that is a long plane flight.  She does have a handout about hip replacement surgery.  She is very athletic and then and active individual with no significant medical issues.  All questions and concerns were answered and addressed.  We will work on scheduling surgery over the next 4 to 6 weeks.

## 2021-03-30 ENCOUNTER — Other Ambulatory Visit: Payer: Self-pay | Admitting: Physician Assistant

## 2021-03-30 DIAGNOSIS — M1611 Unilateral primary osteoarthritis, right hip: Secondary | ICD-10-CM

## 2021-04-05 NOTE — Patient Instructions (Addendum)
DUE TO COVID-19 ONLY ONE VISITOR IS ALLOWED TO COME WITH YOU AND STAY IN THE WAITING ROOM ONLY DURING PRE OP AND PROCEDURE.   **NO VISITORS ARE ALLOWED IN THE SHORT STAY AREA OR RECOVERY ROOM!!**  IF YOU WILL BE ADMITTED INTO THE HOSPITAL YOU ARE ALLOWED ONLY TWO SUPPORT PEOPLE DURING VISITATION HOURS ONLY (7 AM -8PM)    Up to two visitors ages 45+ are allowed at one time in a patient's room.  The visitors may rotate out with other people throughout the day.  Additionally, up to two children between the ages of 89 and 36 are allowed and do not count toward the number of allowed visitors.  Children within this age range must be accompanied by an adult visitor.  One adult visitor may remain with the patient overnight and must be in the room by 8 PM.  COVID SWAB TESTING MUST BE COMPLETED ON:  04-14-21 @   [facility].  Take a seat in the lobby area to the right as you come in the main entrance.  Call [phone]  and give your name and let them know you are here for COVID testing  You are not required to quarantine, however you are required to wear a well-fitted mask when you are out and around people not in your household.  Hand Hygiene often Do NOT share personal items Notify your provider if you are in close contact with someone who has COVID or you develop fever 100.4 or greater, new onset of sneezing, cough, sore throat, shortness of breath or body aches.       Your procedure is scheduled on: Friday, 04-16-21   Report to [facility] Main  Entrance     Report to admitting at 5:15 AM   Call this number if you have problems the morning of surgery [phone]   Do not eat food :After Midnight.   May have liquids until 4:15 AM  day of surgery  CLEAR LIQUID DIET  Foods Allowed                                                                     Foods Excluded  Water, Black Coffee (no milk/no creamer) and tea, regular and decaf                               liquids that you cannot  Plain Jell-O in any flavor  (No red)                         see through such as: Fruit ices (not with fruit pulp)                                 milk, soups, orange juice  Iced Popsicles (No red)                                    All solid food  Apple juices Sports drinks like Gatorade (No red) Lightly seasoned clear broth or consume(fat free) Sugar     Complete one Ensure drink the morning of surgery at  4:15 AM the day of surgery.     The day of surgery:  Drink ONE (1) Pre-Surgery Clear Ensure the morning of surgery. Drink in one sitting. Do not sip.  This drink was given to you during your hospital  pre-op appointment visit. Nothing else to drink after completing the Pre-Surgery Clear Ensure         If you have questions, please contact your surgeons office.     Oral Hygiene is also important to reduce your risk of infection.                                    Remember - BRUSH YOUR TEETH THE MORNING OF SURGERY WITH YOUR REGULAR TOOTHPASTE   Do NOT smoke after Midnight   Take these medicines the morning of surgery with A SIP OF WATER:  Tylenol   Stop all vitamins and herbal supplements a week before surgery              You may not have any metal on your body including hair pins, jewelry, and body piercing             Do not wear make-up, lotions, powders, perfumes or deodorant  Do not wear nail polish including gel and S&S, artificial/acrylic nails, or any other type of covering on natural nails including finger and toenails. If you have artificial nails, gel coating, etc. that needs to be removed by a nail salon please have this removed prior to surgery or surgery may need to be canceled/ delayed if the surgeon/ anesthesia feels like they are unable to be safely monitored.   Do not shave  48 hours prior to surgery.           Do not bring valuables to the hospital. [place].   Contacts, dentures or bridgework may not be worn into surgery.   Bring small overnight bag day of surgery.   Special Instructions: Bring a copy of your healthcare power of attorney and living will documents the day of surgery if you haven't scanned them in before.  Please read over the following fact sheets you were given: IF YOU HAVE QUESTIONS ABOUT YOUR PRE OP INSTRUCTIONS PLEASE CALL [phone]   [facility] - Preparing for Surgery Before surgery, you can play an important role.  Because skin is not sterile, your skin needs to be as free of germs as possible.  You can reduce the number of germs on your skin by washing with CHG (chlorahexidine gluconate) soap before surgery.  CHG is an antiseptic cleaner which kills germs and bonds with the skin to continue killing germs even after washing. Please DO NOT use if you have an allergy to CHG or antibacterial soaps.  If your skin becomes reddened/irritated stop using the CHG and inform your nurse when you arrive at Short Stay. Do not shave (including legs and underarms) for at least 48 hours prior to the first CHG shower.  You may shave your face/neck.  Please follow these instructions carefully:  1.  Shower with CHG Soap the night before surgery and the  morning of surgery.  2.  If you choose to wash your hair, wash your hair first as usual  with your normal  shampoo.  3.  After you shampoo, rinse your hair and body thoroughly to remove the shampoo.                             4.  Use CHG as you would any other liquid soap.  You can apply chg directly to the skin and wash.  Gently with a scrungie or clean washcloth.  5.  Apply the CHG Soap to your body ONLY FROM THE NECK DOWN.   Do   not use on face/ open                           Wound or open sores. Avoid contact with eyes, ears mouth and   genitals (private parts).                       Wash face,  Genitals (private parts) with your normal soap.             6.  Wash thoroughly,  paying special attention to the area where your    surgery  will be performed.  7.  Thoroughly rinse your body with warm water from the neck down.  8.  DO NOT shower/wash with your normal soap after using and rinsing off the CHG Soap.                9.  Pat yourself dry with a clean towel.            10.  Wear clean pajamas.            11.  Place clean sheets on your bed the night of your first shower and do not  sleep with pets. Day of Surgery : Do not apply any lotions/deodorants the morning of surgery.  Please wear clean clothes to the hospital/surgery center.  FAILURE TO FOLLOW THESE INSTRUCTIONS MAY RESULT IN THE CANCELLATION OF YOUR SURGERY  PATIENT SIGNATURE_________________________________  NURSE SIGNATURE__________________________________  ________________________________________________________________________   Adam Phenix  An incentive spirometer is a tool that can help keep your lungs clear and active. This tool measures how well you are filling your lungs with each breath. Taking long deep breaths may help reverse or decrease the chance of developing breathing (pulmonary) problems (especially infection) following: A long period of time when you are unable to move or be active. BEFORE THE PROCEDURE  If the spirometer includes an indicator to show your best effort, your nurse or respiratory therapist will set it to a desired goal. If possible, sit up straight or lean slightly forward. Try not to slouch. Hold the incentive spirometer in an upright position. INSTRUCTIONS FOR USE  Sit on the edge of your bed if possible, or sit up as far as you can in bed or on a chair. Hold the incentive spirometer in an upright position. Breathe out normally. Place the mouthpiece in your mouth and seal your lips tightly around it. Breathe in slowly and as deeply as possible, raising the piston or the ball toward the top of the column. Hold your breath for 3-5 seconds or for as long  as possible. Allow the piston or ball to fall to the bottom of the column. Remove the mouthpiece from your mouth and breathe out normally. Rest for a few seconds and repeat Steps 1 through 7 at least 10 times every 1-2 hours when you  are awake. Take your time and take a few normal breaths between deep breaths. The spirometer may include an indicator to show your best effort. Use the indicator as a goal to work toward during each repetition. After each set of 10 deep breaths, practice coughing to be sure your lungs are clear. If you have an incision (the cut made at the time of surgery), support your incision when coughing by placing a pillow or rolled up towels firmly against it. Once you are able to get out of bed, walk around indoors and cough well. You may stop using the incentive spirometer when instructed by your caregiver.  RISKS AND COMPLICATIONS Take your time so you do not get dizzy or light-headed. If you are in pain, you may need to take or ask for pain medication before doing incentive spirometry. It is harder to take a deep breath if you are having pain. AFTER USE Rest and breathe slowly and easily. It can be helpful to keep track of a log of your progress. Your caregiver can provide you with a simple table to help with this. If you are using the spirometer at home, follow these instructions: [place] IF:  You are having difficultly using the spirometer. You have trouble using the spirometer as often as instructed. Your pain medication is not giving enough relief while using the spirometer. You develop fever of 100.5 F (38.1 C) or higher. SEEK IMMEDIATE MEDICAL CARE IF:  You cough up bloody sputum that had not been present before. You develop fever of 102 F (38.9 C) or greater. You develop worsening pain at or near the incision site. MAKE SURE YOU:  Understand these instructions. Will watch your condition. Will get help right away if you are not doing well or get  worse. Document Released: 06/27/2006 Document Revised: 05/09/2011 Document Reviewed: 08/28/2006 Exit[employer] Patient Information 2014 [employer], [place].   ________________________________________________________________________  WHAT IS A BLOOD TRANSFUSION? Blood Transfusion Information  A transfusion is the replacement of blood or some of its parts. Blood is made up of multiple cells which provide different functions. Red blood cells carry oxygen and are used for blood loss replacement. White blood cells fight against infection. Platelets control bleeding. Plasma helps clot blood. Other blood products are available for specialized needs, such as hemophilia or other clotting disorders. BEFORE THE TRANSFUSION  Who gives blood for transfusions?  Healthy volunteers who are fully evaluated to make sure their blood is safe. This is blood bank blood. Transfusion therapy is the safest it has ever been in the practice of medicine. Before blood is taken from a donor, a complete history is taken to make sure that person has no history of diseases nor engages in risky social behavior (examples are intravenous drug use or sexual activity with multiple partners). The donor's travel history is screened to minimize risk of transmitting infections, such as malaria. The donated blood is tested for signs of infectious diseases, such as HIV and hepatitis. The blood is then tested to be sure it is compatible with you in order to minimize the chance of a transfusion reaction. If you or a relative donates blood, this is often done in anticipation of surgery and is not appropriate for emergency situations. It takes many days to process the donated blood. RISKS AND COMPLICATIONS Although transfusion therapy is very safe and saves many lives, the main dangers of transfusion include:  Getting an infectious disease. Developing a transfusion reaction. This is an allergic reaction to something in  the blood you were given. Every  precaution is taken to prevent this. The decision to have a blood transfusion has been considered carefully by your caregiver before blood is given. Blood is not given unless the benefits outweigh the risks. AFTER THE TRANSFUSION Right after receiving a blood transfusion, you will usually feel much better and more energetic. This is especially true if your red blood cells have gotten low (anemic). The transfusion raises the level of the red blood cells which carry oxygen, and this usually causes an energy increase. The nurse administering the transfusion will monitor you carefully for complications. HOME CARE INSTRUCTIONS  No special instructions are needed after a transfusion. You may find your energy is better. Speak with your caregiver about any limitations on activity for underlying diseases you may have. SEEK MEDICAL CARE IF:  Your condition is not improving after your transfusion. You develop redness or irritation at the intravenous (IV) site. SEEK IMMEDIATE MEDICAL CARE IF:  Any of the following symptoms occur over the next 12 hours: Shaking chills. You have a temperature by mouth above 102 F (38.9 C), not controlled by medicine. Chest, back, or muscle pain. People around you feel you are not acting correctly or are confused. Shortness of breath or difficulty breathing. Dizziness and fainting. You get a rash or develop hives. You have a decrease in urine output. Your urine turns a dark color or changes to pink, red, or brown. Any of the following symptoms occur over the next 10 days: You have a temperature by mouth above 102 F (38.9 C), not controlled by medicine. Shortness of breath. Weakness after normal activity. The white part of the eye turns yellow (jaundice). You have a decrease in the amount of urine or are urinating less often. Your urine turns a dark color or changes to pink, red, or brown. Document Released: 02/12/2000 Document Revised: 05/09/2011 Document Reviewed:  10/01/2007 [facility] Patient Information 2014 [place], [place].  _______________________________________________________________________

## 2021-04-07 ENCOUNTER — Encounter (HOSPITAL_COMMUNITY)
Admission: RE | Admit: 2021-04-07 | Discharge: 2021-04-07 | Disposition: A | Discharge status: Home / Self Care | Payer: Self-pay | Source: Ambulatory Visit | Attending: Orthopaedic Surgery | Admitting: Orthopaedic Surgery

## 2021-04-07 DIAGNOSIS — Z01818 Encounter for other preprocedural examination: Secondary | ICD-10-CM

## 2021-04-07 NOTE — Patient Instructions (Signed)
DUE TO COVID-19 ONLY ONE VISITOR IS ALLOWED TO COME WITH YOU AND STAY IN THE WAITING ROOM ONLY DURING PRE OP AND PROCEDURE.   **NO VISITORS ARE ALLOWED IN THE SHORT STAY AREA OR RECOVERY ROOM!!**  IF YOU WILL BE ADMITTED INTO THE HOSPITAL YOU ARE ALLOWED ONLY TWO SUPPORT PEOPLE DURING VISITATION HOURS ONLY (7 AM -8PM)    Up to two visitors ages 63+ are allowed at one time in a patient's room.  The visitors may rotate out with other people throughout the day.  Additionally, up to two children between the ages of 68 and 51 are allowed and do not count toward the number of allowed visitors.  Children within this age range must be accompanied by an adult visitor.  One adult visitor may remain with the patient overnight and must be in the room by 8 PM.  COVID SWAB TESTING MUST BE COMPLETED ON:  04-14-21 @    COME IN [facility] MAIN ENTRANCE of [place].  Take a seat in the lobby area to the right as you come in the main entrance.  Call [phone]  and give your name and let them know you are here for COVID testing  You are not required to quarantine, however you are required to wear a well-fitted mask when you are out and around people not in your household.  Hand Hygiene often Do NOT share personal items Notify your provider if you are in close contact with someone who has COVID or you develop fever 100.4 or greater, new onset of sneezing, cough, sore throat, shortness of breath or body aches.        Your procedure is scheduled on: Friday, 04-16-21   Report to [facility] Main  Entrance     Report to admitting at 5:15 AM   Call this number if you have problems the morning of surgery [phone]   Do not eat food :After Midnight.   May have liquids until 4:15 AM day of surgery  CLEAR LIQUID DIET  Foods Allowed                                                                     Foods Excluded  Water, Black Coffee (no milk/no creamer) and tea, regular and decaf                               liquids that you cannot  Plain Jell-O in any flavor  (No red)                         see through such as: Fruit ices (not with fruit pulp)                                 milk, soups, orange juice  Iced Popsicles (No red)                                    All solid food  Apple juices Sports drinks like Gatorade (No red) Lightly seasoned clear broth or consume(fat free) Sugar   Complete one Ensure drink the morning of surgery at 4:15 AM the day of surgery.       The day of surgery:  Drink ONE (1) Pre-Surgery Clear Ensure  the morning of surgery. Drink in one sitting. Do not sip.  This drink was given to you during your hospital  pre-op appointment visit. Nothing else to drink after completing the Pre-Surgery Clear Ensure          If you have questions, please contact your surgeons office.     Oral Hygiene is also important to reduce your risk of infection.                                    Remember - BRUSH YOUR TEETH THE MORNING OF SURGERY WITH YOUR REGULAR TOOTHPASTE   Do NOT smoke after Midnight   Take these medicines the morning of surgery with A SIP OF WATER: Tylenol   Stop all vitamins and herbal supplements a week before surgery.     Stop Motrin, Aleve, Ibuprofen a week before surgery.             You may not have any metal on your body including hair pins, jewelry, and body piercing             Do not wear make-up, lotions, powders, perfumes or deodorant  Do not wear nail polish including gel and S&S, artificial/acrylic nails, or any other type of covering on natural nails including finger and toenails. If you have artificial nails, gel coating, etc. that needs to be removed by a nail salon please have this removed prior to surgery or surgery may need to be canceled/ delayed if the surgeon/ anesthesia feels like they are unable to be safely monitored.   Do not shave  48 hours prior to surgery.      Contacts, dentures or  bridgework may not be worn into surgery.  Bring small overnight bag day of surgery.  Do not bring valuables to the hospital. [place] IS NOT RESPONSIBLE FOR VALUABLES.   Special Instructions: Bring a copy of your healthcare power of attorney and living will documents the day of surgery if you haven't scanned them in before.  Please read over the following fact sheets you were given: IF YOU HAVE QUESTIONS ABOUT YOUR PRE OP INSTRUCTIONS PLEASE CALL [phone]   [place] - Preparing for Surgery Before surgery, you can play an important role.  Because skin is not sterile, your skin needs to be as free of germs as possible.  You can reduce the number of germs on your skin by washing with CHG (chlorahexidine gluconate) soap before surgery.  CHG is an antiseptic cleaner which kills germs and bonds with the skin to continue killing germs even after washing. Please DO NOT use if you have an allergy to CHG or antibacterial soaps.  If your skin becomes reddened/irritated stop using the CHG and inform your nurse when you arrive at Short Stay. Do not shave (including legs and underarms) for at least 48 hours prior to the first CHG shower.  You may shave your face/neck.  Please follow these instructions carefully:  1.  Shower with CHG Soap the night before surgery and the  morning of surgery.  2.  If you choose to wash your hair, wash your  hair first as usual with your normal  shampoo.  3.  After you shampoo, rinse your hair and body thoroughly to remove the shampoo.                             4.  Use CHG as you would any other liquid soap.  You can apply chg directly to the skin and wash.  Gently with a scrungie or clean washcloth.  5.  Apply the CHG Soap to your body ONLY FROM THE NECK DOWN.   Do   not use on face/ open                           Wound or open sores. Avoid contact with eyes, ears mouth and   genitals (private parts).                       Wash face,  Genitals (private parts) with  your normal soap.             6.  Wash thoroughly, paying special attention to the area where your    surgery  will be performed.  7.  Thoroughly rinse your body with warm water from the neck down.  8.  DO NOT shower/wash with your normal soap after using and rinsing off the CHG Soap.                9.  Pat yourself dry with a clean towel.            10.  Wear clean pajamas.            11.  Place clean sheets on your bed the night of your first shower and do not  sleep with pets. Day of Surgery : Do not apply any lotions/deodorants the morning of surgery.  Please wear clean clothes to the hospital/surgery center.  FAILURE TO FOLLOW THESE INSTRUCTIONS MAY RESULT IN THE CANCELLATION OF YOUR SURGERY  PATIENT SIGNATURE_________________________________  NURSE SIGNATURE__________________________________  ________________________________________________________________________   Rogelia MireIncentive Spirometer  An incentive spirometer is a tool that can help keep your lungs clear and active. This tool measures how well you are filling your lungs with each breath. Taking long deep breaths may help reverse or decrease the chance of developing breathing (pulmonary) problems (especially infection) following: A long period of time when you are unable to move or be active. BEFORE THE PROCEDURE  If the spirometer includes an indicator to show your best effort, your nurse or respiratory therapist will set it to a desired goal. If possible, sit up straight or lean slightly forward. Try not to slouch. Hold the incentive spirometer in an upright position. INSTRUCTIONS FOR USE  Sit on the edge of your bed if possible, or sit up as far as you can in bed or on a chair. Hold the incentive spirometer in an upright position. Breathe out normally. Place the mouthpiece in your mouth and seal your lips tightly around it. Breathe in slowly and as deeply as possible, raising the piston or the ball toward the top of the  column. Hold your breath for 3-5 seconds or for as long as possible. Allow the piston or ball to fall to the bottom of the column. Remove the mouthpiece from your mouth and breathe out normally. Rest for a few seconds and repeat Steps 1 through 7 at least 10 times every  1-2 hours when you are awake. Take your time and take a few normal breaths between deep breaths. The spirometer may include an indicator to show your best effort. Use the indicator as a goal to work toward during each repetition. After each set of 10 deep breaths, practice coughing to be sure your lungs are clear. If you have an incision (the cut made at the time of surgery), support your incision when coughing by placing a pillow or rolled up towels firmly against it. Once you are able to get out of bed, walk around indoors and cough well. You may stop using the incentive spirometer when instructed by your caregiver.  RISKS AND COMPLICATIONS Take your time so you do not get dizzy or light-headed. If you are in pain, you may need to take or ask for pain medication before doing incentive spirometry. It is harder to take a deep breath if you are having pain. AFTER USE Rest and breathe slowly and easily. It can be helpful to keep track of a log of your progress. Your caregiver can provide you with a simple table to help with this. If you are using the spirometer at home, follow these instructions: SEEK MEDICAL CARE IF:  You are having difficultly using the spirometer. You have trouble using the spirometer as often as instructed. Your pain medication is not giving enough relief while using the spirometer. You develop fever of 100.5 F (38.1 C) or higher. SEEK IMMEDIATE MEDICAL CARE IF:  You cough up bloody sputum that had not been present before. You develop fever of 102 F (38.9 C) or greater. You develop worsening pain at or near the incision site. MAKE SURE YOU:  Understand these instructions. Will watch your  condition. Will get help right away if you are not doing well or get worse. Document Released: 06/27/2006 Document Revised: 05/09/2011 Document Reviewed: 08/28/2006 Exit[employer] Patient Information 2014 [employer], [place].   ________________________________________________________________________  WHAT IS A BLOOD TRANSFUSION? Blood Transfusion Information  A transfusion is the replacement of blood or some of its parts. Blood is made up of multiple cells which provide different functions. Red blood cells carry oxygen and are used for blood loss replacement. White blood cells fight against infection. Platelets control bleeding. Plasma helps clot blood. Other blood products are available for specialized needs, such as hemophilia or other clotting disorders. BEFORE THE TRANSFUSION  Who gives blood for transfusions?  Healthy volunteers who are fully evaluated to make sure their blood is safe. This is blood bank blood. Transfusion therapy is the safest it has ever been in the practice of medicine. Before blood is taken from a donor, a complete history is taken to make sure that person has no history of diseases nor engages in risky social behavior (examples are intravenous drug use or sexual activity with multiple partners). The donor's travel history is screened to minimize risk of transmitting infections, such as malaria. The donated blood is tested for signs of infectious diseases, such as HIV and hepatitis. The blood is then tested to be sure it is compatible with you in order to minimize the chance of a transfusion reaction. If you or a relative donates blood, this is often done in anticipation of surgery and is not appropriate for emergency situations. It takes many days to process the donated blood. RISKS AND COMPLICATIONS Although transfusion therapy is very safe and saves many lives, the main dangers of transfusion include:  Getting an infectious disease. Developing a transfusion reaction. This is  an  allergic reaction to something in the blood you were given. Every precaution is taken to prevent this. The decision to have a blood transfusion has been considered carefully by your caregiver before blood is given. Blood is not given unless the benefits outweigh the risks. AFTER THE TRANSFUSION Right after receiving a blood transfusion, you will usually feel much better and more energetic. This is especially true if your red blood cells have gotten low (anemic). The transfusion raises the level of the red blood cells which carry oxygen, and this usually causes an energy increase. The nurse administering the transfusion will monitor you carefully for complications. HOME CARE INSTRUCTIONS  No special instructions are needed after a transfusion. You may find your energy is better. Speak with your caregiver about any limitations on activity for underlying diseases you may have. SEEK MEDICAL CARE IF:  Your condition is not improving after your transfusion. You develop redness or irritation at the intravenous (IV) site. SEEK IMMEDIATE MEDICAL CARE IF:  Any of the following symptoms occur over the next 12 hours: Shaking chills. You have a temperature by mouth above 102 F (38.9 C), not controlled by medicine. Chest, back, or muscle pain. People around you feel you are not acting correctly or are confused. Shortness of breath or difficulty breathing. Dizziness and fainting. You get a rash or develop hives. You have a decrease in urine output. Your urine turns a dark color or changes to pink, red, or brown. Any of the following symptoms occur over the next 10 days: You have a temperature by mouth above 102 F (38.9 C), not controlled by medicine. Shortness of breath. Weakness after normal activity. The white part of the eye turns yellow (jaundice). You have a decrease in the amount of urine or are urinating less often. Your urine turns a dark color or changes to pink, red, or brown. Document  Released: 02/12/2000 Document Revised: 05/09/2011 Document Reviewed: 10/01/2007 [facility] Patient Information 2014 [place], [place].  _______________________________________________________________________

## 2021-04-08 ENCOUNTER — Other Ambulatory Visit (HOSPITAL_COMMUNITY): Payer: Self-pay

## 2021-04-13 ENCOUNTER — Inpatient Hospital Stay (HOSPITAL_COMMUNITY)
Admission: RE | Admit: 2021-04-13 | Discharge: 2021-04-13 | Disposition: A | Discharge status: Home / Self Care | Payer: Self-pay | Source: Ambulatory Visit

## 2021-04-28 ENCOUNTER — Encounter: Payer: Self-pay | Admitting: Orthopaedic Surgery

## 2021-04-29 ENCOUNTER — Other Ambulatory Visit: Payer: Self-pay

## 2021-04-29 ENCOUNTER — Encounter: Payer: Self-pay | Admitting: Orthopaedic Surgery

## 2021-04-30 NOTE — Pre-Procedure Instructions (Signed)
Surgical Instructions ? ? ? Your procedure is scheduled on Thursday, May 06, 2021 at 7:30 AM. ? Report to [facility] Main Entrance "A" at 5:30 A.M., then check in with the Admitting office. ? Call this number if you have problems the morning of surgery: ? [phone] ? ? If you have any questions prior to your surgery date call [phone]: Open Monday-Friday 8am-4pm ? ? ? Remember: ? Do not eat after midnight the night before your surgery ? ?You may drink clear liquids until 4:30 AM the morning of your surgery.   ?Clear liquids allowed are: Water, Non-Citrus Juices (without pulp), Carbonated Beverages, Clear Tea, Black Coffee Only (NO MILK, CREAM OR POWDERED CREAMER of any kind), and Gatorade. ? ? ?Enhanced Recovery after Surgery for Orthopedics ?Enhanced Recovery after Surgery is a protocol used to improve the stress on your body and your recovery after surgery. ? ?Patient Instructions ? ?The day of surgery (if you do NOT have diabetes):  ?Drink ONE (1) Pre-Surgery Clear Ensure by 4:30 am the morning of surgery   ?This drink was given to you during your hospital  ?pre-op appointment visit. ?Nothing else to drink after completing the  ?Pre-Surgery Clear Ensure. ? ?       If you have questions, please contact your surgeon?s office. ? ?  ? Take these medicines the morning of surgery with A SIP OF WATER: ? ?acetaminophen (TYLENOL) ? ?As of today, STOP taking any Aspirin (unless otherwise instructed by your surgeon) Aleve, Naproxen, Motrin, ibuprofen (ADVIL), Goody's, BC's, all herbal medications, fish oil, and all vitamins. ?         ?           ?Do NOT Smoke (Tobacco/Vaping) for 24 hours prior to your procedure. ? ?If you use a CPAP at night, you may bring your mask/headgear for your overnight stay. ?  ?Contacts, glasses, piercing's, hearing aid's, dentures or partials may not be worn into surgery, please bring cases for these belongings.  ?  ?For patients admitted to the hospital, discharge time will be  determined by your treatment team. ?  ?Patients discharged the day of surgery will not be allowed to drive home, and someone needs to stay with them for 24 hours. ? ?NO VISITORS WILL BE ALLOWED IN PRE-OP WHERE PATIENTS ARE PREPPED FOR SURGERY.  ONLY 1 SUPPORT PERSON MAY BE PRESENT IN THE WAITING ROOM WHILE YOU ARE IN SURGERY.  IF YOU ARE TO BE ADMITTED, ONCE YOU ARE IN YOUR ROOM YOU WILL BE ALLOWED TWO (2) VISITORS. (1) VISITOR MAY STAY OVERNIGHT BUT MUST ARRIVE TO THE ROOM BY 8pm.  Minor children may have two parents present. Special consideration for safety and communication needs will be reviewed on a case by case basis. ? ? ?Special instructions:   ?[facility]- Preparing For Surgery ? ?Before surgery, you can play an important role. Because skin is not sterile, your skin needs to be as free of germs as possible. You can reduce the number of germs on your skin by washing with CHG (chlorahexidine gluconate) Soap before surgery.  CHG is an antiseptic cleaner which kills germs and bonds with the skin to continue killing germs even after washing.   ? ?Oral Hygiene is also important to reduce your risk of infection.  Remember - BRUSH YOUR TEETH THE MORNING OF SURGERY WITH YOUR REGULAR TOOTHPASTE ? ?Please do not use if you have an allergy to CHG or antibacterial soaps. If your skin becomes reddened/irritated stop using the  CHG.  ?Do not shave (including legs and underarms) for at least 48 hours prior to first CHG shower. It is OK to shave your face. ? ?Please follow these instructions carefully. ?  ?Shower the NIGHT BEFORE SURGERY and the MORNING OF SURGERY ? ?If you chose to wash your hair, wash your hair first as usual with your normal shampoo. ? ?After you shampoo, rinse your hair and body thoroughly to remove the shampoo. ? ?Use CHG Soap as you would any other liquid soap. You can apply CHG directly to the skin and wash gently with a scrungie or a clean washcloth.  ? ?Apply the CHG Soap to your body ONLY FROM THE  NECK DOWN.  Do not use on open wounds or open sores. Avoid contact with your eyes, ears, mouth and genitals (private parts). Wash Face and genitals (private parts)  with your normal soap.  ? ?Wash thoroughly, paying special attention to the area where your surgery will be performed. ? ?Thoroughly rinse your body with warm water from the neck down. ? ?DO NOT shower/wash with your normal soap after using and rinsing off the CHG Soap. ? ?Pat yourself dry with a CLEAN TOWEL. ? ?Wear CLEAN PAJAMAS to bed the night before surgery ? ?Place CLEAN SHEETS on your bed the night before your surgery ? ?DO NOT SLEEP WITH PETS. ? ? ?Day of Surgery: ?Take a shower with CHG soap. ?Do not wear jewelry or makeup ?Do not wear lotions, powders, perfumes, or deodorant. ?Do not shave 48 hours prior to surgery. ?Do not bring valuables to the hospital. [facility] is not responsible for any belongings or valuables. ?Do not wear nail polish, gel polish, artificial nails, or any other type of covering on natural nails (fingers and toes) ?If you have artificial nails or gel coating that need to be removed by a nail salon, please have this removed prior to surgery. Artificial nails or gel coating may interfere with anesthesia's ability to adequately monitor your vital signs. ?Wear Clean/Comfortable clothing the morning of surgery ?Do not apply any deodorants/lotions.   ?Remember to brush your teeth WITH YOUR REGULAR TOOTHPASTE. ?  ?Please read over the following fact sheets that you were given. ? ? ?3 days prior to your procedure or After your COVID test ? ?? You are not required to quarantine however you are required to wear a well-fitting mask when you are out and around people not in your household. If your mask becomes wet or soiled, replace with a new one. ? ?? Wash your hands often with soap and water for 20 seconds or clean your hands with an alcohol-based hand sanitizer that contains at least 60% alcohol. ? ?? Do not share personal  items. ? ?? Notify your provider: ? ?o if you are in close contact with someone who has COVID ? ?o or if you develop a fever of 100.4 or greater, sneezing, cough, sore throat, shortness of breath or body aches.  ? ?

## 2021-05-03 ENCOUNTER — Encounter (HOSPITAL_COMMUNITY): Payer: Self-pay

## 2021-05-03 ENCOUNTER — Encounter (HOSPITAL_COMMUNITY)
Admission: RE | Admit: 2021-05-03 | Discharge: 2021-05-03 | Disposition: A | Discharge status: Home / Self Care | Payer: BC Managed Care – PPO | Source: Ambulatory Visit | Attending: Orthopaedic Surgery | Admitting: Orthopaedic Surgery

## 2021-05-03 ENCOUNTER — Other Ambulatory Visit: Payer: Self-pay

## 2021-05-03 VITALS — BP 121/77 | HR 73 | Temp 99.0°F | Resp 17 | Ht 67.0 in | Wt 132.6 lb

## 2021-05-03 DIAGNOSIS — Z01812 Encounter for preprocedural laboratory examination: Secondary | ICD-10-CM | POA: Diagnosis not present

## 2021-05-03 DIAGNOSIS — Z20822 Contact with and (suspected) exposure to covid-19: Secondary | ICD-10-CM | POA: Diagnosis not present

## 2021-05-03 DIAGNOSIS — Z01818 Encounter for other preprocedural examination: Secondary | ICD-10-CM

## 2021-05-03 LAB — CBC
HCT: 34.7 % — ABNORMAL LOW (ref 36.0–46.0)
Hemoglobin: 11.7 g/dL — ABNORMAL LOW (ref 12.0–15.0)
MCH: 31.4 pg (ref 26.0–34.0)
MCHC: 33.7 g/dL (ref 30.0–36.0)
MCV: 93 fL (ref 80.0–100.0)
Platelets: 209 10*3/uL (ref 150–400)
RBC: 3.73 MIL/uL — ABNORMAL LOW (ref 3.87–5.11)
RDW: 12.8 % (ref 11.5–15.5)
WBC: 5.9 10*3/uL (ref 4.0–10.5)
nRBC: 0 % (ref 0.0–0.2)

## 2021-05-03 LAB — TYPE AND SCREEN
ABO/RH(D): O POS
Antibody Screen: NEGATIVE

## 2021-05-03 LAB — SARS CORONAVIRUS 2 (TAT 6-24 HRS): SARS Coronavirus 2: NEGATIVE

## 2021-05-03 LAB — SURGICAL PCR SCREEN
MRSA, PCR: NEGATIVE
Staphylococcus aureus: NEGATIVE

## 2021-05-03 NOTE — Progress Notes (Signed)
PCP - Dr. Marden Noble ?Cardiologist - denies ? ?PPM/ICD - denies ? ? ?Chest x-ray - 09/19/11 ?EKG - 09/23/11 ?Stress Test - denies ?ECHO - denies ?Cardiac Cath - denies ? ?Sleep Study - denies ? ? ?DM- denies ? ?Blood Thinner Instructions: n/a ?Aspirin Instructions: n/a ? ?ERAS Protcol - yes ?PRE-SURGERY Ensure given at PAT ? ?COVID TEST- 05/03/21 at PAT ? ? ?Anesthesia review: no ? ?Patient denies shortness of breath, fever, cough and chest pain at PAT appointment ? ? ?All instructions explained to the patient, with a verbal understanding of the material. Patient agrees to go over the instructions while at home for a better understanding. Patient also instructed to wear a mask in public after being tested for COVID-19. The opportunity to ask questions was provided. ?  ?

## 2021-05-05 NOTE — Anesthesia Preprocedure Evaluation (Addendum)
Anesthesia Evaluation  ?Patient identified by MRN, date of birth, ID band ?Patient awake ? ? ? ?Reviewed: ?Allergy & Precautions, H&P , NPO status , Patient's Chart, lab work & pertinent test results ? ?Airway ?Mallampati: II ? ?TM Distance: >3 FB ?Neck ROM: Full ? ? ? Dental ?no notable dental hx. ?(+) Teeth Intact, Dental Advisory Given ?  ?Pulmonary ?neg pulmonary ROS,  ?  ?Pulmonary exam normal ?breath sounds clear to auscultation ? ? ? ? ? ? Cardiovascular ?Exercise Tolerance: Good ?negative cardio ROS ? ? ?Rhythm:Regular Rate:Normal ? ? ?  ?Neuro/Psych ?negative neurological ROS ? negative psych ROS  ? GI/Hepatic ?negative GI ROS, Neg liver ROS,   ?Endo/Other  ?negative endocrine ROS ? Renal/GU ?negative Renal ROS  ?negative genitourinary ?  ?Musculoskeletal ? ? Abdominal ?  ?Peds ? Hematology ?negative hematology ROS ?(+)   ?Anesthesia Other Findings ? ? Reproductive/Obstetrics ?negative OB ROS ? ?  ? ? ? ? ? ? ? ? ? ? ? ? ? ?  ?  ? ? ? ? ? ? ? ?Anesthesia Physical ?Anesthesia Plan ? ?ASA: 1 ? ?Anesthesia Plan: Spinal  ? ?Post-op Pain Management: Tylenol PO (pre-op)*  ? ?Induction: Intravenous ? ?PONV Risk Score and Plan: 3 and Propofol infusion, Midazolam, Ondansetron and Dexamethasone ? ?Airway Management Planned: Simple Face Mask and Natural Airway ? ?Additional Equipment:  ? ?Intra-op Plan:  ? ?Post-operative Plan:  ? ?Informed Consent: I have reviewed the patients History and Physical, chart, labs and discussed the procedure including the risks, benefits and alternatives for the proposed anesthesia with the patient or authorized representative who has indicated his/her understanding and acceptance.  ? ? ? ?Dental advisory given ? ?Plan Discussed with: CRNA ? ?Anesthesia Plan Comments:   ? ? ? ? ? ?Anesthesia Quick Evaluation ? ?

## 2021-05-06 ENCOUNTER — Encounter (HOSPITAL_COMMUNITY)
Admission: RE | Disposition: A | Discharge status: Home Health | Payer: Self-pay | Source: Home / Self Care | Attending: Orthopaedic Surgery

## 2021-05-06 ENCOUNTER — Ambulatory Visit (HOSPITAL_COMMUNITY): Payer: BC Managed Care – PPO | Admitting: Anesthesiology

## 2021-05-06 ENCOUNTER — Observation Stay (HOSPITAL_COMMUNITY): Payer: BC Managed Care – PPO

## 2021-05-06 ENCOUNTER — Other Ambulatory Visit: Payer: Self-pay

## 2021-05-06 ENCOUNTER — Ambulatory Visit (HOSPITAL_COMMUNITY): Payer: BC Managed Care – PPO

## 2021-05-06 ENCOUNTER — Observation Stay (HOSPITAL_COMMUNITY)
Admission: RE | Admit: 2021-05-06 | Discharge: 2021-05-07 | Disposition: A | Discharge status: Home Health | Payer: BC Managed Care – PPO | Attending: Orthopaedic Surgery | Admitting: Orthopaedic Surgery

## 2021-05-06 ENCOUNTER — Encounter (HOSPITAL_COMMUNITY): Payer: Self-pay | Admitting: Orthopaedic Surgery

## 2021-05-06 DIAGNOSIS — M1611 Unilateral primary osteoarthritis, right hip: Secondary | ICD-10-CM | POA: Diagnosis not present

## 2021-05-06 DIAGNOSIS — Z96641 Presence of right artificial hip joint: Secondary | ICD-10-CM | POA: Diagnosis not present

## 2021-05-06 DIAGNOSIS — I878 Other specified disorders of veins: Secondary | ICD-10-CM | POA: Diagnosis not present

## 2021-05-06 DIAGNOSIS — Z419 Encounter for procedure for purposes other than remedying health state, unspecified: Secondary | ICD-10-CM

## 2021-05-06 DIAGNOSIS — Z471 Aftercare following joint replacement surgery: Secondary | ICD-10-CM | POA: Diagnosis not present

## 2021-05-06 HISTORY — PX: TOTAL HIP ARTHROPLASTY: SHX124

## 2021-05-06 LAB — ABO/RH: ABO/RH(D): O POS

## 2021-05-06 SURGERY — ARTHROPLASTY, HIP, TOTAL, ANTERIOR APPROACH
Anesthesia: Spinal | Site: Hip | Laterality: Right

## 2021-05-06 MED ORDER — PROPOFOL 10 MG/ML IV BOLUS
INTRAVENOUS | Status: AC
  Filled 2021-05-06: qty 20

## 2021-05-06 MED ORDER — ACETAMINOPHEN 500 MG PO TABS
1000.0000 mg | ORAL_TABLET | Freq: Once | ORAL | Status: AC
  Administered 2021-05-06: 06:00:00 1000 mg via ORAL
  Filled 2021-05-06: qty 2

## 2021-05-06 MED ORDER — 0.9 % SODIUM CHLORIDE (POUR BTL) OPTIME
TOPICAL | Status: DC | PRN
Start: 2021-05-06 — End: 2021-05-06
  Administered 2021-05-06: 08:00:00 1000 mL

## 2021-05-06 MED ORDER — METOCLOPRAMIDE HCL 5 MG/ML IJ SOLN: 5.0000 mg | Freq: Three times a day (TID) | INTRAMUSCULAR | Status: DC | PRN

## 2021-05-06 MED ORDER — FENTANYL CITRATE (PF) 250 MCG/5ML IJ SOLN
INTRAMUSCULAR | Status: AC
  Filled 2021-05-06: qty 5

## 2021-05-06 MED ORDER — FENTANYL CITRATE (PF) 250 MCG/5ML IJ SOLN
INTRAMUSCULAR | Status: DC | PRN
Start: 2021-05-06 — End: 2021-05-06
  Administered 2021-05-06: 75 ug via INTRAVENOUS
  Administered 2021-05-06: 25 ug via INTRAVENOUS

## 2021-05-06 MED ORDER — FENTANYL CITRATE (PF) 100 MCG/2ML IJ SOLN
25.0000 ug | INTRAMUSCULAR | Status: DC | PRN
  Administered 2021-05-06 (×3): 25 ug via INTRAVENOUS

## 2021-05-06 MED ORDER — HYDROMORPHONE HCL 1 MG/ML IJ SOLN
INTRAMUSCULAR | Status: AC
  Filled 2021-05-06: qty 1

## 2021-05-06 MED ORDER — OXYCODONE HCL 5 MG PO TABS: 5.0000 mg | ORAL_TABLET | ORAL | Status: DC | PRN

## 2021-05-06 MED ORDER — PHENYLEPHRINE 40 MCG/ML (10ML) SYRINGE FOR IV PUSH (FOR BLOOD PRESSURE SUPPORT)
PREFILLED_SYRINGE | INTRAVENOUS | Status: DC | PRN
  Administered 2021-05-06: 80 ug via INTRAVENOUS
  Administered 2021-05-06 (×7): 40 ug via INTRAVENOUS

## 2021-05-06 MED ORDER — SODIUM CHLORIDE 0.9 % IR SOLN
Status: DC | PRN
Start: 2021-05-06 — End: 2021-05-06
  Administered 2021-05-06: 1000 mL

## 2021-05-06 MED ORDER — LIDOCAINE 2% (20 MG/ML) 5 ML SYRINGE
INTRAMUSCULAR | Status: DC | PRN
  Administered 2021-05-06: 20 mg via INTRAVENOUS

## 2021-05-06 MED ORDER — TRANEXAMIC ACID-NACL 1000-0.7 MG/100ML-% IV SOLN
1000.0000 mg | INTRAVENOUS | Status: AC
  Administered 2021-05-06: 08:00:00 1000 mg via INTRAVENOUS
  Filled 2021-05-06: qty 100

## 2021-05-06 MED ORDER — DOCUSATE SODIUM 100 MG PO CAPS
100.0000 mg | ORAL_CAPSULE | Freq: Two times a day (BID) | ORAL | Status: DC
  Administered 2021-05-06 – 2021-05-07 (×2): 100 mg via ORAL
  Filled 2021-05-06 (×2): qty 1

## 2021-05-06 MED ORDER — METHOCARBAMOL 500 MG PO TABS
ORAL_TABLET | ORAL | Status: AC
  Filled 2021-05-06: qty 1

## 2021-05-06 MED ORDER — ONDANSETRON HCL 4 MG PO TABS: 4.0000 mg | ORAL_TABLET | Freq: Four times a day (QID) | ORAL | Status: DC | PRN

## 2021-05-06 MED ORDER — ONDANSETRON HCL 4 MG/2ML IJ SOLN
INTRAMUSCULAR | Status: DC | PRN
  Administered 2021-05-06: 4 mg via INTRAVENOUS

## 2021-05-06 MED ORDER — ORAL CARE MOUTH RINSE: 15.0000 mL | Freq: Once | OROMUCOSAL | Status: AC

## 2021-05-06 MED ORDER — ALUM & MAG HYDROXIDE-SIMETH 200-200-20 MG/5ML PO SUSP: 30.0000 mL | ORAL | Status: DC | PRN

## 2021-05-06 MED ORDER — PHENYLEPHRINE 40 MCG/ML (10ML) SYRINGE FOR IV PUSH (FOR BLOOD PRESSURE SUPPORT)
PREFILLED_SYRINGE | INTRAVENOUS | Status: AC
  Filled 2021-05-06: qty 10

## 2021-05-06 MED ORDER — FENTANYL CITRATE (PF) 100 MCG/2ML IJ SOLN
INTRAMUSCULAR | Status: AC
  Filled 2021-05-06: qty 2

## 2021-05-06 MED ORDER — METOCLOPRAMIDE HCL 5 MG PO TABS: 5.0000 mg | ORAL_TABLET | Freq: Three times a day (TID) | ORAL | Status: DC | PRN

## 2021-05-06 MED ORDER — FENTANYL CITRATE (PF) 100 MCG/2ML IJ SOLN
INTRAMUSCULAR | Status: AC
  Administered 2021-05-06: 10:00:00 50 ug via INTRAVENOUS
  Filled 2021-05-06: qty 2

## 2021-05-06 MED ORDER — CHLORHEXIDINE GLUCONATE 0.12 % MT SOLN
15.0000 mL | Freq: Once | OROMUCOSAL | Status: AC
  Administered 2021-05-06: 06:00:00 15 mL via OROMUCOSAL
  Filled 2021-05-06: qty 15

## 2021-05-06 MED ORDER — METHOCARBAMOL 500 MG PO TABS
500.0000 mg | ORAL_TABLET | Freq: Four times a day (QID) | ORAL | Status: DC | PRN
  Administered 2021-05-06 – 2021-05-07 (×2): 500 mg via ORAL
  Filled 2021-05-06 (×2): qty 1

## 2021-05-06 MED ORDER — MENTHOL 3 MG MT LOZG: 1.0000 | LOZENGE | OROMUCOSAL | Status: DC | PRN

## 2021-05-06 MED ORDER — ONDANSETRON HCL 4 MG/2ML IJ SOLN
INTRAMUSCULAR | Status: AC
  Filled 2021-05-06: qty 2

## 2021-05-06 MED ORDER — PHENOL 1.4 % MT LIQD: 1.0000 | OROMUCOSAL | Status: DC | PRN

## 2021-05-06 MED ORDER — DEXAMETHASONE SODIUM PHOSPHATE 10 MG/ML IJ SOLN
INTRAMUSCULAR | Status: AC
  Filled 2021-05-06: qty 1

## 2021-05-06 MED ORDER — SODIUM CHLORIDE 0.9 % IV SOLN: INTRAVENOUS | Status: DC

## 2021-05-06 MED ORDER — ONDANSETRON HCL 4 MG/2ML IJ SOLN: 4.0000 mg | Freq: Four times a day (QID) | INTRAMUSCULAR | Status: DC | PRN

## 2021-05-06 MED ORDER — MIDAZOLAM HCL 2 MG/2ML IJ SOLN
INTRAMUSCULAR | Status: AC
  Filled 2021-05-06: qty 2

## 2021-05-06 MED ORDER — ASPIRIN 81 MG PO CHEW
81.0000 mg | CHEWABLE_TABLET | Freq: Two times a day (BID) | ORAL | Status: DC
  Administered 2021-05-06 – 2021-05-07 (×2): 81 mg via ORAL
  Filled 2021-05-06 (×2): qty 1

## 2021-05-06 MED ORDER — LIDOCAINE 2% (20 MG/ML) 5 ML SYRINGE
INTRAMUSCULAR | Status: AC
  Filled 2021-05-06: qty 10

## 2021-05-06 MED ORDER — BUPIVACAINE IN DEXTROSE 0.75-8.25 % IT SOLN
INTRATHECAL | Status: DC | PRN
  Administered 2021-05-06: 1.6 mL via INTRATHECAL

## 2021-05-06 MED ORDER — CEFAZOLIN SODIUM-DEXTROSE 2-4 GM/100ML-% IV SOLN
2.0000 g | INTRAVENOUS | Status: AC
  Administered 2021-05-06: 08:00:00 2 g via INTRAVENOUS
  Filled 2021-05-06: qty 100

## 2021-05-06 MED ORDER — HYDROMORPHONE HCL 1 MG/ML IJ SOLN
0.5000 mg | INTRAMUSCULAR | Status: DC | PRN
  Administered 2021-05-06 – 2021-05-07 (×2): 0.5 mg via INTRAVENOUS
  Filled 2021-05-06 (×2): qty 1

## 2021-05-06 MED ORDER — PROPOFOL 500 MG/50ML IV EMUL
INTRAVENOUS | Status: DC | PRN
  Administered 2021-05-06: 40 ug/kg/min via INTRAVENOUS

## 2021-05-06 MED ORDER — OXYCODONE HCL 5 MG PO TABS
10.0000 mg | ORAL_TABLET | ORAL | Status: DC | PRN
  Administered 2021-05-06 – 2021-05-07 (×4): 10 mg via ORAL
  Administered 2021-05-07: 15 mg via ORAL
  Filled 2021-05-06: qty 2
  Filled 2021-05-06: qty 3
  Filled 2021-05-06: qty 2
  Filled 2021-05-06: qty 3
  Filled 2021-05-06: qty 2

## 2021-05-06 MED ORDER — LACTATED RINGERS IV SOLN: INTRAVENOUS | Status: DC

## 2021-05-06 MED ORDER — POVIDONE-IODINE 10 % EX SWAB
2.0000 "application " | Freq: Once | CUTANEOUS | Status: AC
  Administered 2021-05-06: 2 via TOPICAL

## 2021-05-06 MED ORDER — DIPHENHYDRAMINE HCL 12.5 MG/5ML PO ELIX: 12.5000 mg | ORAL_SOLUTION | ORAL | Status: DC | PRN

## 2021-05-06 MED ORDER — ACETAMINOPHEN 325 MG PO TABS
325.0000 mg | ORAL_TABLET | Freq: Four times a day (QID) | ORAL | Status: DC | PRN
  Administered 2021-05-06: 15:00:00 650 mg via ORAL
  Filled 2021-05-06: qty 2

## 2021-05-06 MED ORDER — CEFAZOLIN SODIUM-DEXTROSE 1-4 GM/50ML-% IV SOLN
1.0000 g | Freq: Four times a day (QID) | INTRAVENOUS | Status: AC
  Administered 2021-05-06 (×2): 1 g via INTRAVENOUS
  Filled 2021-05-06 (×2): qty 50

## 2021-05-06 MED ORDER — PANTOPRAZOLE SODIUM 40 MG PO TBEC
40.0000 mg | DELAYED_RELEASE_TABLET | Freq: Every day | ORAL | Status: DC
  Administered 2021-05-07: 40 mg via ORAL
  Filled 2021-05-06 (×2): qty 1

## 2021-05-06 MED ORDER — METHOCARBAMOL 1000 MG/10ML IJ SOLN
500.0000 mg | Freq: Four times a day (QID) | INTRAVENOUS | Status: DC | PRN
  Filled 2021-05-06: qty 5

## 2021-05-06 MED ORDER — DEXAMETHASONE SODIUM PHOSPHATE 10 MG/ML IJ SOLN
INTRAMUSCULAR | Status: DC | PRN
  Administered 2021-05-06: 5 mg via INTRAVENOUS

## 2021-05-06 MED ORDER — PROPOFOL 10 MG/ML IV BOLUS
INTRAVENOUS | Status: DC | PRN
  Administered 2021-05-06: 30 mg via INTRAVENOUS

## 2021-05-06 MED ORDER — MIDAZOLAM HCL 2 MG/2ML IJ SOLN
INTRAMUSCULAR | Status: DC | PRN
  Administered 2021-05-06: 2 mg via INTRAVENOUS

## 2021-05-06 SURGICAL SUPPLY — 52 items
BAG COUNTER SPONGE SURGICOUNT (BAG) ×2 IMPLANT
BENZOIN TINCTURE PRP APPL 2/3 (GAUZE/BANDAGES/DRESSINGS) ×1 IMPLANT
BLADE CLIPPER SURG (BLADE) IMPLANT
BLADE SAW SGTL 18X1.27X75 (BLADE) ×2 IMPLANT
CLSR STERI-STRIP ANTIMIC 1/2X4 (GAUZE/BANDAGES/DRESSINGS) ×1 IMPLANT
COVER SURGICAL LIGHT HANDLE (MISCELLANEOUS) ×2 IMPLANT
CUP SECTOR GRIPTON 50MM (Cup) ×1 IMPLANT
DRAPE C-ARM 42X72 X-RAY (DRAPES) ×2 IMPLANT
DRAPE STERI IOBAN 125X83 (DRAPES) ×2 IMPLANT
DRAPE U-SHAPE 47X51 STRL (DRAPES) ×6 IMPLANT
DRSG AQUACEL AG ADV 3.5X10 (GAUZE/BANDAGES/DRESSINGS) ×1 IMPLANT
DURAPREP 26ML APPLICATOR (WOUND CARE) ×2 IMPLANT
ELECT BLADE 6.5 EXT (BLADE) IMPLANT
ELECT REM PT RETURN 9FT ADLT (ELECTROSURGICAL) ×2
ELECTRODE REM PT RTRN 9FT ADLT (ELECTROSURGICAL) ×1 IMPLANT
FACESHIELD WRAPAROUND (MASK) ×4 IMPLANT
FACESHIELD WRAPAROUND OR TEAM (MASK) ×2 IMPLANT
GLOVE SRG 8 PF TXTR STRL LF DI (GLOVE) ×2 IMPLANT
GLOVE SURG LTX SZ8 (GLOVE) ×2 IMPLANT
GLOVE SURG ORTHO LTX SZ7.5 (GLOVE) ×4 IMPLANT
GLOVE SURG UNDER POLY LF SZ8 (GLOVE) ×4
GOWN STRL REUS W/ TWL LRG LVL3 (GOWN DISPOSABLE) ×2 IMPLANT
GOWN STRL REUS W/ TWL XL LVL3 (GOWN DISPOSABLE) ×2 IMPLANT
GOWN STRL REUS W/TWL LRG LVL3 (GOWN DISPOSABLE) ×4
GOWN STRL REUS W/TWL XL LVL3 (GOWN DISPOSABLE) ×4
HANDPIECE INTERPULSE COAX TIP (DISPOSABLE) ×2
HEAD FEMORAL 32 CERAMIC (Hips) ×1 IMPLANT
KIT BASIN OR (CUSTOM PROCEDURE TRAY) ×2 IMPLANT
KIT TURNOVER KIT B (KITS) ×2 IMPLANT
LINER ACETABULAR 32X50 (Liner) ×1 IMPLANT
MANIFOLD NEPTUNE II (INSTRUMENTS) ×2 IMPLANT
NS IRRIG 1000ML POUR BTL (IV SOLUTION) ×2 IMPLANT
PACK TOTAL JOINT (CUSTOM PROCEDURE TRAY) ×2 IMPLANT
PAD ARMBOARD 7.5X6 YLW CONV (MISCELLANEOUS) ×2 IMPLANT
SCREW 6.5MMX25MM (Screw) ×1 IMPLANT
SET HNDPC FAN SPRY TIP SCT (DISPOSABLE) ×1 IMPLANT
STAPLER VISISTAT 35W (STAPLE) IMPLANT
STEM FEM ACTIS HIGH SZ3 (Stem) ×1 IMPLANT
SUT ETHIBOND NAB CT1 #1 30IN (SUTURE) ×2 IMPLANT
SUT MNCRL AB 4-0 PS2 18 (SUTURE) IMPLANT
SUT VIC AB 0 CT1 27 (SUTURE) ×2
SUT VIC AB 0 CT1 27XBRD ANBCTR (SUTURE) ×1 IMPLANT
SUT VIC AB 1 CT1 27 (SUTURE) ×2
SUT VIC AB 1 CT1 27XBRD ANBCTR (SUTURE) ×1 IMPLANT
SUT VIC AB 2-0 CT1 27 (SUTURE) ×2
SUT VIC AB 2-0 CT1 TAPERPNT 27 (SUTURE) ×1 IMPLANT
TOWEL GREEN STERILE (TOWEL DISPOSABLE) ×2 IMPLANT
TOWEL GREEN STERILE FF (TOWEL DISPOSABLE) ×2 IMPLANT
TRAY CATH 16FR W/PLASTIC CATH (SET/KITS/TRAYS/PACK) IMPLANT
TRAY FOLEY W/BAG SLVR 16FR (SET/KITS/TRAYS/PACK)
TRAY FOLEY W/BAG SLVR 16FR ST (SET/KITS/TRAYS/PACK) IMPLANT
WATER STERILE IRR 1000ML POUR (IV SOLUTION) ×4 IMPLANT

## 2021-05-06 NOTE — Op Note (Signed)
NAME: Christie Fitzpatrick, Christie Fitzpatrick ?MEDICAL RECORD NO: [id number] ?ACCOUNT NO: [id number] ?DATE OF BIRTH: 04-27-1955 ?FACILITY: [facility] ?LOCATION: [facility] ?PHYSICIAN: Vanita Panda. Magnus Ivan, MD ? ?Operative Report  ? ?DATE OF PROCEDURE: 05/06/2021 ? ? ?PREOPERATIVE DIAGNOSIS:   Primary osteoarthritis, degenerative joint disease, right hip. ? ?POSTOPERATIVE DIAGNOSIS:  Primary osteoarthritis, degenerative joint disease, right hip.  ? ?PROCEDURE:  Right total hip arthroplasty through direct anterior approach. ? ?IMPLANTS:  DePuy sector Gription acetabular component, size 50 with a single screw, size 32+0 neutral polyethylene liner, size 3 ACTIS femoral component with high offset, size 32+1 ceramic hip ball. ? ?SURGEON:  Vanita Panda. Magnus Ivan, MD ? ?ASSISTANT:  Rexene Edison, PA-C. ? ?ANESTHESIA:  Spinal. ? ?ANTIBIOTICS:  2 g IV Ancef.  ? ?BLOOD LOSS:  100 mL. ? ?COMPLICATIONS:  None. ? ?INDICATIONS:  The patient is a very pleasant and incredibly active and thin 66 year old female with unfortunately debilitating arthritis involving her right hip.  This probably started off as femoroacetabular impingement.  All the last year plain films  ?did not show significant narrowing.  An MRI of her right hip was obtained, which showed significant arthritic findings in the femoral head and acetabulum as well as degenerative tearing of the labrum.  She also had an effusion.  She has tried and failed  ?all forms of conservative treatment including even intra-articular steroid injections.  At this point, her right hip pain is daily and is detrimentally affecting her mobility, her quality of life, and her activities of daily living.  I agree with her  ?proceeding with the hip replacement at this standpoint given the failure of conservative treatment. We did talk in length and detail about the risks of acute blood loss anemia, nerve or vessel injury, fracture, infection, implant failure, DVT, leg length ? discrepancies and skin and soft tissue issues.   She understands our goals are to decrease pain, improve mobility and overall improve quality of life. ? ?DESCRIPTION OF PROCEDURE:  After informed consent was obtained, appropriate right hip was marked.  She was brought to the operating room and sat up on the stretcher where spinal anesthesia was obtained.  She was laid in supine position on the stretcher.  ? I was able to assess her leg lengths, which were equal. Traction boots were placed on both her feet.  A Foley catheter was placed as well.  She was then placed supine on the Hana fracture table.  The perineal post in place and both legs in line skeletal ? traction devices and no traction applied.  We then assessed her hip again radiographically and there is certainly narrowing of the joint surface as well comparing her right operative hip to her left. We got good intraoperative pictures for leg length  ?and assessment of offset purposes.  We then prepped the right hip with DuraPrep and sterile drapes.  A timeout was called.  She was identified correct patient, correct right hip.  I then made an incision just inferior and posterior to the anterior superior iliac  ?spine and carried this slightly obliquely down the leg.  We dissected down tensor fascia lata muscle.  Tensor fascia was then divided longitudinally to proceed with direct anterior approach to the hip.  We identified and cauterized circumflex vessels and ? identified the hip capsule, opened the hip capsule in L-type format finding a moderate joint effusion.  I placed curved retractors around the medial and lateral femoral neck and made a femoral neck cut with an oscillating saw, proximal to the lesser  ?  trochanter.  We completed this with an osteotome.  A corkscrew guide was placed in the femoral head and the femoral head was removed in its entirety without difficulty.  There was a wide area devoid of cartilage and this was definitely worsened from her  ?plain films and her MRI findings.  I then placed  a bent Hohmann over the medial acetabular rim and removed remnants of acetabular labrum and other debris.  We then began reaming under direct visualization from a size 43 reamer in a stepwise increments  ?going up to a size 50 reamer with all reamers placed under direct visualization.  Last reamer placed under direct fluoroscopy, so we could obtain our depth of reaming, our inclination and anteversion.  I then placed real DePuy sector Gription acetabular  ?component size 50 and went with a single screw given how athletic she is.  There was good bleeding bone around the acetabular component and good seating of it.  We then went with a 32+0 neutral polyethylene liner given her preoperative leg lengths were  ?equal as well.  Attention was then turned to the femur.  With the leg externally rotated to 120 degrees, extended and adducted, we were able to place a Mueller retractor medially and Hohman retractor behind the greater trochanter.  We released lateral  ?joint capsule and used a box cutting osteotome to enter femoral canal and a rongeur to lateralize, we then began broaching using the ACTIS broaching system from a size 0 going to a size 3.  With a size 3 in place, we trialed a standard offset femoral  ?neck and a 32+1 trial hip ball, we reduced this in the acetabulum.  We felt like we needed just a little bit more offset and leg lengths seemed to be equal.  We dislocated the hip and removed the trial components.  We went with the real femoral  ?component, which was ACTIS size 3, but a high offset femoral component and the real 32+1 ceramic hip ball.  Again, reduced this in the acetabulum.  We were pleased with leg length, offset, range of motion and stability assessed mechanically and  ?radiographically.  Stability was definitely the most important thing in the assessment.  We then irrigated the soft tissue with normal saline solution.  We were able to get joint capsule closure with #1 Ethibond suture.  The tensor  fascia was closed with ? #1 Vicryl followed by 0 Vicryl to close the deep tissue and 2-0 Vicryl to close subcutaneous tissue, 3-0 Monocryl subcuticular stitch was utilized as well as Steri-Strips on the skin.  An Aquacel dressing was placed.  She was taken off the Hana table  ?and taken to recovery room in stable condition with all final counts being correct.  There were no complications noted.  Of note, Richardean Canal, PA-C did assist during the entire case.  His assistance was helpful from beginning to end with retraction and ? soft tissue management as well as assisting with component placement and layered closure of the wound.  His assistance was medically necessary.  ? ? ? ?SUJ ?D: 05/06/2021 8:54:46 am T: 05/06/2021 9:39:00 am  ?JOB: [id number]/ [id number]  ?

## 2021-05-06 NOTE — Anesthesia Postprocedure Evaluation (Signed)
Anesthesia Post Note ? ?Patient: Christie Fitzpatrick ? ?Procedure(s) Performed: Right TOTAL HIP ARTHROPLASTY ANTERIOR APPROACH (Right: Hip) ? ?  ? ?Patient location during evaluation: PACU ?Anesthesia Type: Spinal ?Level of consciousness: oriented and awake and alert ?Pain management: pain level controlled ?Vital Signs Assessment: post-procedure vital signs reviewed and stable ?Respiratory status: spontaneous breathing and respiratory function stable ?Cardiovascular status: blood pressure returned to baseline and stable ?Postop Assessment: no headache, no backache, no apparent nausea or vomiting, spinal receding and patient able to bend at knees ?Anesthetic complications: no ? ? ?No notable events documented. ? ?Last Vitals:  ?Vitals:  ? 05/06/21 0957 05/06/21 1011  ?BP: 101/77 105/75  ?Pulse: (!) 54 (!) 53  ?Resp: 14 15  ?Temp:    ?SpO2: 100% 98%  ?  ?Last Pain:  ?Vitals:  ? 05/06/21 0957  ?TempSrc:   ?PainSc: 7   ? ? ?  ?  ?  ?  ?  ?  ? ?Lynasia Meloche,W. EDMOND ? ? ? ? ?

## 2021-05-06 NOTE — Anesthesia Procedure Notes (Signed)
Procedure Name: Douglas ?Date/Time: 05/06/2021 7:40 AM ?Performed by: Janace Litten, CRNA ?Pre-anesthesia Checklist: Patient identified, Suction available, Emergency Drugs available and Patient being monitored ?Patient Re-evaluated:Patient Re-evaluated prior to induction ?Oxygen Delivery Method: Simple face mask ? ? ? ? ?

## 2021-05-06 NOTE — Discharge Instructions (Signed)

## 2021-05-06 NOTE — Evaluation (Signed)
Physical Therapy Evaluation ?Patient Details ?Name: Christie Fitzpatrick ?MRN: [id number] ?DOB: 1956-02-11 ?Today's Date: 05/06/2021 ? ?History of Present Illness ? Pt is a 66 yo F presenting s/p R THA, direct anterior approach. PMH includes R elbow fracture and surgery (1979).  ?Clinical Impression ? Pt presents s/p R THA direct anterior approach. Pt impairments include increased pain and decreased strength, range of motion, and balance with mobility tasks. Pt required supervision to min guard assist with RW during mobility tasks. SPT educated pt on pain modulation during activity. Pt educated on ankle pumps (20/hour) to perform this afternoon/evening as well as quad sets to pt's tolerance. PT to progress mobility as tolerated, and will continue to follow acutely to maximize pt's independence with functional mobility. ?   ? ?Recommendations for follow up therapy are one component of a multi-disciplinary discharge planning process, led by the attending physician.  Recommendations may be updated based on patient status, additional functional criteria and insurance authorization. ? ?Follow Up Recommendations Follow physician's recommendations for discharge plan and follow up therapies ? ?  ?Assistance Recommended at Discharge Intermittent Supervision/Assistance  ?Patient can return home with the following ? A little help with walking and/or transfers;A little help with bathing/dressing/bathroom;Assistance with cooking/housework;Assist for transportation;Help with stairs or ramp for entrance ? ?  ?Equipment Recommendations None recommended by PT  ?Recommendations for Other Services ?    ?  ?Functional Status Assessment Patient has had a recent decline in their functional status and demonstrates the ability to make significant improvements in function in a reasonable and predictable amount of time.  ? ?  ?Precautions / Restrictions Precautions ?Precautions: Fall ?Restrictions ?Weight Bearing Restrictions: Yes ?RLE Weight Bearing:  Weight bearing as tolerated  ? ?  ? ?Mobility ? Bed Mobility ?Overal bed mobility: Needs Assistance ?Bed Mobility: Supine to Sit ?  ?  ?Supine to sit: Supervision ?  ?  ?General bed mobility comments: pt required supervision with sitting EOB but no physical assist or cues needed ?  ? ?Transfers ?Overall transfer level: Needs assistance ?Equipment used: Rolling walker (2 wheels) ?Transfers: Sit to/from Stand ?Sit to Stand: Min guard ?  ?  ?  ?  ?  ?General transfer comment: required min guard for safety; cues needed for hand placement and education on using RW for transfer. ?  ? ?Ambulation/Gait ?Ambulation/Gait assistance: Supervision ?Gait Distance (Feet): 160 Feet ?Assistive device: Rolling walker (2 wheels) ?Gait Pattern/deviations: Step-through pattern, Decreased step length - right, Narrow base of support ?Gait velocity: decreased ?Gait velocity interpretation: >2.62 ft/sec, indicative of community ambulatory ?  ?General Gait Details: pt had slow gait patterns with decreased heel strike on operative LE. Pt able to ambulate with fluid gait and equal weight bearing bilaterally. Given cues to increase heel strike on RLE. Pt really wanted to push through her pain and walk since she has been laying down for so long. Pain at an 8/10 at end of session. ? ?Stairs ?  ?  ?  ?  ?  ? ?Wheelchair Mobility ?  ? ?Modified Rankin (Stroke Patients Only) ?  ? ?  ? ?Balance Overall balance assessment: Needs assistance ?Sitting-balance support: Feet supported, Single extremity supported ?Sitting balance-Leahy Scale: Good ?Sitting balance - Comments: pt did have dizziness in sitting that resolved quickly. ?  ?  ?Standing balance-Leahy Scale: Good ?Standing balance comment: pt able to stand at sink with one hand or no hands on RW while she brushed her teeth. ?  ?  ?  ?  ?  ?  ?  ?  ?  ?  ?  ?   ? ? ? ?  Pertinent Vitals/Pain Pain Assessment ?Pain Assessment: 0-10 ?Pain Score: 8  ?Pain Location: R hip operative site ?Pain Descriptors /  Indicators: Grimacing, Discomfort  ? ? ?Home Living Family/patient expects to be discharged to:: Private residence ?Living Arrangements: Spouse/significant other;Children ?Available Help at Discharge: Family;Available PRN/intermittently ?Type of Home: House ?  ?  ?  ?Alternate Level Stairs-Number of Steps: flight ?Home Layout: Two level ?Home Equipment: Agricultural consultantolling Walker (2 wheels);Shower seat ?Additional Comments: pt's office is on the 2nd floor of her house and she will need to get to it post D/C. Pt's spouse having shoulder surgery today. Pt's kids will stay with them post D/C to help out  ?  ?Prior Function Prior Level of Function : Working/employed;Driving;Independent/Modified Independent ?  ?  ?  ?  ?  ?  ?Mobility Comments: independent with no use of AD ?ADLs Comments: independent ?  ? ? ?Hand Dominance  ? Dominant Hand: Right ? ?  ?Extremity/Trunk Assessment  ? Upper Extremity Assessment ?Upper Extremity Assessment: Overall WFL for tasks assessed ?  ? ?Lower Extremity Assessment ?Lower Extremity Assessment: RLE deficits/detail ?RLE Deficits / Details: consistent with R THA ?  ? ?Cervical / Trunk Assessment ?Cervical / Trunk Assessment: Normal  ?Communication  ? Communication: No difficulties  ?Cognition Arousal/Alertness: Awake/alert ?Behavior [facility] for tasks assessed/performed ?Overall Cognitive Status: Within Functional Limits for tasks assessed ?  ?  ?  ?  ?  ?  ?  ?  ?  ?  ?  ?  ?  ?  ?  ?  ?  ?  ?  ? ?  ?General Comments   ? ?  ?Exercises Total Joint Exercises ?Ankle Circles/Pumps: 10 reps, Both, Seated ?Quad Sets: Right, 5 reps, Strengthening, Seated  ? ?Assessment/Plan  ?  ?PT Assessment Patient needs continued PT services  ?PT Problem List Decreased strength;Decreased activity tolerance;Decreased balance;Decreased mobility;Decreased coordination;Decreased safety awareness;Decreased knowledge of use of DME;Pain ? ?   ?  ?PT Treatment Interventions DME instruction;Gait training;Stair  training;Functional mobility training;Therapeutic activities;Therapeutic exercise;Neuromuscular re-education;Balance training;Patient/family education   ? ?PT Goals (Current goals can be found in the Care Plan section)  ?Acute Rehab PT Goals ?Patient Stated Goal: to go home and climb steps to her office ?PT Goal Formulation: With patient ?Time For Goal Achievement: 05/20/21 ?Potential to Achieve Goals: Good ? ?  ?Frequency 7X/week ?  ? ? ?Co-evaluation   ?  ?  ?  ?  ? ? ?  ?AM-PAC PT "6 Clicks" Mobility  ?Outcome Measure Help needed turning from your back to your side while in a flat bed without using bedrails?: None ?Help needed moving from lying on your back to sitting on the side of a flat bed without using bedrails?: A Little ?Help needed moving to and from a bed to a chair (including a wheelchair)?: A Little ?Help needed standing up from a chair using your arms (e.g., wheelchair or bedside chair)?: A Little ?Help needed to walk in hospital room?: A Little ?Help needed climbing 3-5 steps with a railing? : A Lot ?6 Click Score: 18 ? ?  ?End of Session Equipment Utilized During Treatment: Gait belt ?Activity Tolerance: Patient tolerated treatment well ?Patient left: in chair;with call bell/phone within reach ?Nurse Communication: Mobility status (pt requesting removal of foley catheter) ?PT Visit Diagnosis: Unsteadiness on feet (R26.81);Muscle weakness (generalized) (M62.81);Pain ?Pain - Right/Left: Right ?Pain - part of body: Hip ?  ? ?Time: [id number] ?PT Time Calculation (min) (ACUTE ONLY): 24 min ? ? ?Charges:  PT Evaluation ?$PT Eval Low Complexity: 1 Low ?PT Treatments ?$Gait Training: 8-22 mins ?  ?   ? ? ?Enis Slipper, SPT ? ? ?Rutherford Nail Shamila Lerch ?05/06/2021, 3:48 PM ? ?

## 2021-05-06 NOTE — Anesthesia Procedure Notes (Signed)
Spinal ? ?Patient location during procedure: OR ?Start time: 05/06/2021 7:34 AM ?End time: 05/06/2021 7:38 AM ?Reason for block: surgical anesthesia ?Staffing ?Performed: anesthesiologist  ?Anesthesiologist: Gaynelle Adu, MD ?Preanesthetic Checklist ?Completed: patient identified, IV checked, risks and benefits discussed, surgical consent, monitors and equipment checked, pre-op evaluation and timeout performed ?Spinal Block ?Patient position: sitting ?Prep: DuraPrep ?Patient monitoring: cardiac monitor, continuous pulse ox and blood pressure ?Approach: midline ?Location: L3-4 ?Injection technique: single-shot ?Needle ?Needle type: Pencan  ?Needle gauge: 24 G ?Needle length: 9 cm ?Assessment ?Sensory level: T8 ?Events: CSF return ?Additional Notes ?Functioning IV was confirmed and monitors were applied. Sterile prep and drape, including hand hygiene and sterile gloves were used. The patient was positioned and the spine was prepped. The skin was anesthetized with lidocaine.  Free flow of clear CSF was obtained prior to injecting local anesthetic into the CSF.  The spinal needle aspirated freely following injection.  The needle was carefully withdrawn.  The patient tolerated the procedure well.  ? ? ? ?

## 2021-05-06 NOTE — Brief Op Note (Signed)
05/06/2021 ? ?8:56 AM ? ?PATIENT:  Christie Fitzpatrick  66 y.o. female ? ?PRE-OPERATIVE DIAGNOSIS:  Osteoarthritis Right Hip ? ?POST-OPERATIVE DIAGNOSIS:  Osteoarthritis Right Hip ? ?PROCEDURE:  Procedure(s): ?Right TOTAL HIP ARTHROPLASTY ANTERIOR APPROACH (Right) ? ?SURGEON:  Surgeon(s) and Role: ?   Kathryne Hitch, MD - Primary ? ?PHYSICIAN ASSISTANT:  Rexene Edison, PA-C ? ?ANESTHESIA:   spinal ? ?EBL:  115 mL  ? ?COUNTS:  YES ? ?DICTATION: .Other Dictation: Dictation Number [id number] ? ?PLAN OF CARE: Admit for overnight observation ? ?PATIENT DISPOSITION:  PACU - hemodynamically stable. ?  ?Delay start of Pharmacological VTE agent (>24hrs) due to surgical blood loss or risk of bleeding: no ? ?

## 2021-05-06 NOTE — H&P (Signed)
TOTAL HIP ADMISSION H&P ? ?Patient is admitted for right total hip arthroplasty. ? ?Subjective: ? ?Chief Complaint: right hip pain ? ?HPI: Christie Fitzpatrick, 66 y.o. female, has a history of pain and functional disability in the right hip(s) due to arthritis and patient has failed non-surgical conservative treatments for greater than 12 weeks to include NSAID's and/or analgesics, corticosteriod injections, flexibility and strengthening excercises, and activity modification.  Onset of symptoms was gradual starting 3 years ago with gradually worsening course since that time.The patient noted no past surgery on the right hip(s).  Patient currently rates pain in the right hip at 10 out of 10 with activity. Patient has night pain, worsening of pain with activity and weight bearing, pain that interfers with activities of daily living, and pain with passive range of motion. Patient has evidence of subchondral sclerosis, periarticular osteophytes, and joint space narrowing by imaging studies. This condition presents safety issues increasing the risk of falls.  There is no current active infection. ? ?Patient Active Problem List  ? Diagnosis Date Noted  ? Unilateral primary osteoarthritis, right hip 05/06/2021  ? Chronic right hip pain 11/17/2020  ? Non-insertional Achilles tendinopathy 11/17/2020  ? ?History reviewed. No pertinent past medical history.  ?Past Surgical History:  ?Procedure Laterality Date  ? ELBOW SURGERY Right 1978  ? dislocated elbow with fractured radius  ? TONSILLECTOMY    ? removed as a child  ? WISDOM TOOTH EXTRACTION  1973  ?  ?Current Facility-Administered Medications  ?Medication Dose Route Frequency Provider Last Rate Last Admin  ? ceFAZolin (ANCEF) IVPB 2g/100 mL premix  2 g Intravenous On Call to OR Kirtland Bouchard, PA-C      ? lactated ringers infusion   Intravenous Continuous Beryle Lathe, MD      ? tranexamic acid (CYKLOKAPRON) IVPB 1,000 mg  1,000 mg Intravenous To OR Kirtland Bouchard, PA-C       ? ?No Known Allergies  ?Social History  ? ?Tobacco Use  ? Smoking status: Never  ? Smokeless tobacco: Never  ?Substance Use Topics  ? Alcohol use: Yes  ?  Alcohol/week: 5.0 standard drinks  ?  Types: 5 Glasses of wine per week  ?  ?Family History  ?Problem Relation Age of Onset  ? Squamous cell carcinoma Mother   ?     Multiple SCC and BCC throughout life. Metastasis in her early-80s.  ? Other Father 60  ?     Bile duct cancer in his early-60s. Metastasized to other parts of GI tract.  ? Skin cancer Sister   ?     Multiple SCC and BCC.  ? Skin cancer Brother   ?     Multple SCC and BCC.  ? Uterine cancer Maternal Aunt 25  ?     d.25  ? Alzheimer's disease Paternal Uncle 30  ?  ? ?Review of Systems  ?All other systems reviewed and are negative. ? ?Objective: ? ?Physical Exam ?Vitals reviewed.  ?Constitutional:   ?   Appearance: Normal appearance.  ?HENT:  ?   Head: Normocephalic and atraumatic.  ?Eyes:  ?   Extraocular Movements: Extraocular movements intact.  ?   Pupils: Pupils are equal, round, and reactive to light.  ?Cardiovascular:  ?   Rate and Rhythm: Normal rate and regular rhythm.  ?Pulmonary:  ?   Effort: Pulmonary effort is normal.  ?   Breath sounds: Normal breath sounds.  ?Abdominal:  ?   Palpations: Abdomen is soft.  ?Musculoskeletal:  ?  Cervical back: Normal range of motion and neck supple.  ?   Right hip: Tenderness and bony tenderness present. Decreased range of motion. Decreased strength.  ?Neurological:  ?   Mental Status: She is alert and oriented to person, place, and time.  ?Psychiatric:     ?   Behavior: Behavior normal.  ? ? ?Vital signs in last 24 hours: ?Temp:  [98.4 ?F (36.9 ?C)] 98.4 ?F (36.9 ?C) (03/09 0545) ?Pulse Rate:  [60] 60 (03/09 0545) ?Resp:  [18] 18 (03/09 0545) ?BP: (115)/(79) 115/79 (03/09 0545) ?SpO2:  [100 %] 100 % (03/09 0545) ?Weight:  [59 kg] 59 kg (03/09 0545) ? ?Labs: ? ? ?Estimated body mass index is 20.36 kg/m? as calculated from the following: ?  Height as of  this encounter: 5\' 7"  (1.702 m). ?  Weight as of this encounter: 59 kg. ? ? ?Imaging Review ?Plain radiographs demonstrate moderate degenerative joint disease of the right hip(s). The bone quality appears to be excellent for age and reported activity level. ? ? ? ? ? ?Assessment/Plan: ? ?End stage arthritis, right hip(s) ? ?The patient history, physical examination, clinical judgement of the provider and imaging studies are consistent with end stage degenerative joint disease of the right hip(s) and total hip arthroplasty is deemed medically necessary. The treatment options including medical management, injection therapy, arthroscopy and arthroplasty were discussed at length. The risks and benefits of total hip arthroplasty were presented and reviewed. The risks due to aseptic loosening, infection, stiffness, dislocation/subluxation,  thromboembolic complications and other imponderables were discussed.  The patient acknowledged the explanation, agreed to proceed with the plan and consent was signed. Patient is being admitted for inpatient treatment for surgery, pain control, PT, OT, prophylactic antibiotics, VTE prophylaxis, progressive ambulation and ADL's and discharge planning.The patient is planning to be discharged home with home health services ? ? ? ?

## 2021-05-06 NOTE — Transfer of Care (Signed)
Immediate Anesthesia Transfer of Care Note ? ?Patient: Christie Fitzpatrick ? ?Procedure(s) Performed: Right TOTAL HIP ARTHROPLASTY ANTERIOR APPROACH (Right: Hip) ? ?Patient Location: PACU ? ?Anesthesia Type:MAC combined with regional for post-op pain ? ?Level of Consciousness: drowsy, patient cooperative and responds to stimulation ? ?Airway & Oxygen Therapy: Patient Spontanous Breathing ? ?Post-op Assessment: Report given to RN and Post -op Vital signs reviewed and stable ? ?Post vital signs: Reviewed and stable ? ?Last Vitals:  ?Vitals Value Taken Time  ?BP 94/65 05/06/21 0911  ?Temp 36.3 ?C 05/06/21 0911  ?Pulse 56 05/06/21 0915  ?Resp 11 05/06/21 0915  ?SpO2 98 % 05/06/21 0915  ?Vitals shown include unvalidated device data. ? ?Last Pain:  ?Vitals:  ? 05/06/21 0611  ?TempSrc:   ?PainSc: 5   ?   ? ?Patients Stated Pain Goal: 3 (05/06/21 4383) ? ?Complications: No notable events documented. ?

## 2021-05-07 ENCOUNTER — Encounter (HOSPITAL_COMMUNITY): Payer: Self-pay | Admitting: Orthopaedic Surgery

## 2021-05-07 ENCOUNTER — Other Ambulatory Visit (HOSPITAL_BASED_OUTPATIENT_CLINIC_OR_DEPARTMENT_OTHER): Payer: Self-pay | Admitting: Orthopaedic Surgery

## 2021-05-07 DIAGNOSIS — M1611 Unilateral primary osteoarthritis, right hip: Secondary | ICD-10-CM | POA: Diagnosis not present

## 2021-05-07 LAB — BASIC METABOLIC PANEL
Anion gap: 5 (ref 5–15)
BUN: 10 mg/dL (ref 8–23)
CO2: 26 mmol/L (ref 22–32)
Calcium: 8.8 mg/dL — ABNORMAL LOW (ref 8.9–10.3)
Chloride: 104 mmol/L (ref 98–111)
Creatinine, Ser: 0.72 mg/dL (ref 0.44–1.00)
GFR, Estimated: >60 mL/min (ref >60)
Glucose, Bld: 138 mg/dL — ABNORMAL HIGH (ref 70–99)
Potassium: 4.1 mmol/L (ref 3.5–5.1)
Sodium: 135 mmol/L (ref 135–145)

## 2021-05-07 LAB — CBC
HCT: 29.7 % — ABNORMAL LOW (ref 36.0–46.0)
Hemoglobin: 9.9 g/dL — ABNORMAL LOW (ref 12.0–15.0)
MCH: 30.8 pg (ref 26.0–34.0)
MCHC: 33.3 g/dL (ref 30.0–36.0)
MCV: 92.5 fL (ref 80.0–100.0)
Platelets: 161 10*3/uL (ref 150–400)
RBC: 3.21 MIL/uL — ABNORMAL LOW (ref 3.87–5.11)
RDW: 12.5 % (ref 11.5–15.5)
WBC: 7.9 10*3/uL (ref 4.0–10.5)
nRBC: 0 % (ref 0.0–0.2)

## 2021-05-07 MED ORDER — HYDROMORPHONE HCL 4 MG PO TABS: 4.0000 mg | ORAL_TABLET | ORAL | 0 refills | Status: DC | PRN

## 2021-05-07 MED ORDER — HYDROMORPHONE HCL 2 MG PO TABS: 2.0000 mg | ORAL_TABLET | ORAL | 0 refills | Status: DC | PRN

## 2021-05-07 MED ORDER — METHOCARBAMOL 500 MG PO TABS: 500.0000 mg | ORAL_TABLET | Freq: Four times a day (QID) | ORAL | 1 refills | Status: DC | PRN

## 2021-05-07 MED ORDER — ASPIRIN 81 MG PO CHEW: 81.0000 mg | CHEWABLE_TABLET | Freq: Two times a day (BID) | ORAL | 0 refills | Status: DC

## 2021-05-07 MED ORDER — OXYCODONE HCL 5 MG PO TABS: 5.0000 mg | ORAL_TABLET | ORAL | 0 refills | Status: DC | PRN

## 2021-05-07 NOTE — Progress Notes (Signed)
Subjective: ?1 Day Post-Op Procedure(s) (LRB): ?Right TOTAL HIP ARTHROPLASTY ANTERIOR APPROACH (Right) ?Patient reports pain as moderate.  Awake and alert this morning.  Did not sleep well last night with more significant pain.  Is more comfortable this morning.  Is slightly hypotensive.  Hemoglobin is 9.9 which is a minimal drop compared to her preoperative hemoglobin of just over 11.  This is more of a chronic anemia with just a slight acute component. ? ?Objective: ?Vital signs in last 24 hours: ?Temp:  [97.4 ?F (36.3 ?C)-98.9 ?F (37.2 ?C)] 98 ?F (36.7 ?C) (03/10 4010) ?Pulse Rate:  [47-67] 67 (03/10 0646) ?Resp:  [10-18] 16 (03/10 0646) ?BP: (92-112)/(54-80) 92/56 (03/10 2725) ?SpO2:  [96 %-100 %] 99 % (03/10 0646) ? ?Intake/Output from previous day: ?03/09 0701 - 03/10 0700 ?In: 1400 [I.V.:1300; IV Piggyback:100] ?Out: 1150 [Urine:1050; Blood:100] ?Intake/Output this shift: ?No intake/output data recorded. ? ?Recent Labs  ?  05/07/21 ?0319  ?HGB 9.9*  ? ?Recent Labs  ?  05/07/21 ?0319  ?WBC 7.9  ?RBC 3.21*  ?HCT 29.7*  ?PLT 161  ? ?Recent Labs  ?  05/07/21 ?0319  ?NA 135  ?K 4.1  ?CL 104  ?CO2 26  ?BUN 10  ?CREATININE 0.72  ?GLUCOSE 138*  ?CALCIUM 8.8*  ? ?No results for input(s): LABPT, INR in the last 72 hours. ? ?Sensation intact distally ?Intact pulses distally ?Dorsiflexion/Plantar flexion intact ?Incision: scant drainage ? ? ?Assessment/Plan: ?1 Day Post-Op Procedure(s) (LRB): ?Right TOTAL HIP ARTHROPLASTY ANTERIOR APPROACH (Right) ?She can be up with therapy today.  The plan will be to discharge later this afternoon. ? ? ? ? ? ?Kathryne Hitch ?05/07/2021, 8:39 AM ? ?

## 2021-05-07 NOTE — Plan of Care (Signed)

## 2021-05-07 NOTE — Progress Notes (Addendum)
Physical Therapy Treatment ?Patient Details ?Name: Christie Fitzpatrick ?MRN: [id number] ?DOB: Dec 29, 1955 ?Today's Date: 05/07/2021 ? ? ?History of Present Illness Pt is a 66 yo F presenting s/p R THA, direct anterior approach. PMH includes R elbow fracture and surgery (1979). ? ?  ?PT Comments  ? ? Focus of session today was functional tranfers, gait, and stair training. The patient tolerated well.  Pt. Shows overall improvement with positional tolerance, functional mobility, gait pattern and speed, and dynamic balance. Pain, R LE ROM, and strength are/is still limiting function. Pt. Would benefit from skilled PT to continue to address her functional mobility, dynamic balance, and ability to climb a full flight of stairs.  Plan for afternoon follow up to complete education. Discharge setting remains unchanged and should follow physician reccomendations. Pt to follow acutely as appropriate.  ?   ?Recommendations for follow up therapy are one component of a multi-disciplinary discharge planning process, led by the attending physician.  Recommendations may be updated based on patient status, additional functional criteria and insurance authorization. ? ?Follow Up Recommendations ? Follow physician's recommendations for discharge plan and follow up therapies ?  ?  ?Assistance Recommended at Discharge Intermittent Supervision/Assistance  ?Patient can return home with the following A little help with walking and/or transfers;A little help with bathing/dressing/bathroom;Assistance with cooking/housework;Assist for transportation;Help with stairs or ramp for entrance ?  ?Equipment Recommendations ? None recommended by PT  ?  ?Recommendations for Other Services   ? ? ?  ?Precautions / Restrictions Precautions ?Precautions: None ?Restrictions ?Weight Bearing Restrictions: Yes ?RLE Weight Bearing: Weight bearing as tolerated  ?  ? ?Mobility ? Bed Mobility ?Overal bed mobility: Needs Assistance ?Bed Mobility: Supine to Sit ?  ?  ?Supine  to sit: Supervision ?  ?  ?General bed mobility comments: Cues for sequencing and use of L LE to help support R LE during transfers ?  ? ?Transfers ?Overall transfer level: Needs assistance ?  ?Transfers: Sit to/from Stand ?Sit to Stand: Supervision ?  ?  ?  ?  ?  ?General transfer comment: Pt. able to perfrom STS with no equipment, uses RW once fully standing to ambulate ?  ? ?Ambulation/Gait ?Ambulation/Gait assistance: Supervision ?Gait Distance (Feet): 150 Feet ?Assistive device: Rolling walker (2 wheels) ?Gait Pattern/deviations: Step-through pattern, Decreased step length - right, Decreased stance time - right ?Gait velocity: WFL ?  ?  ?General Gait Details: Pt. shows WFL gait speed, slight guarding of R LE and decreased WB to that side. Minimally reliant on RW during gait. ? ? ?Stairs ?Stairs: Yes ?Stairs assistance: Supervision ?Stair Management: Alternating pattern, One rail Right, Forwards ?Number of Stairs: 3 ?General stair comments: Pt. cued for proper sequencing, performs well with alternating pattern, complains of slight "pulling" in R LE during ascent ? ? ?Wheelchair Mobility ?  ? ?Modified Rankin (Stroke Patients Only) ?  ? ? ?  ?Balance Overall balance assessment: Modified Independent ?  ?Sitting balance-Leahy Scale: Good ?  ?  ?Standing balance support: No upper extremity supported ?Standing balance-Leahy Scale: Good ?Standing balance comment: Pt. able to stand without support and self steady. Uses RW or crusing hand held assist on furntiture only when moving for support. ?  ?  ?  ?  ?  ?  ?  ?  ?  ?  ?  ?  ? ?  ?Cognition Arousal/Alertness: Awake/alert ?Behavior During Therapy: [facility] for tasks assessed/performed ?Overall Cognitive Status: Within Functional Limits for tasks assessed ?  ?  ?  ?  ?  ?  ?  ?  ?  ?  ?  ?  ?  ?  ?  ?  ?  ?  ?  ? ?  ?  Exercises Total Joint Exercises ?Ankle Circles/Pumps: AROM, Both, 10 reps ?Quad Sets: Right, 5 reps, Strengthening, Supine ? ?  ?General Comments   ?  ?   ? ?Pertinent Vitals/Pain Pain Assessment ?Pain Score: 6  ?Pain Location: R hip operative site ?Pain Descriptors / Indicators: Grimacing, Discomfort ?Pain Intervention(s): Limited activity within patient's tolerance, Monitored during session, Patient requesting pain meds-RN notified (Pain meds in prepartion for next session)  ? ? ?Home Living   ?  ?  ?  ?  ?  ?  ?  ?  ?  ?   ?  ?Prior Function    ?  ?  ?   ? ?PT Goals (current goals can now be found in the care plan section) Acute Rehab PT Goals ?Patient Stated Goal: to go home and climb steps to her office ?PT Goal Formulation: With patient ?Time For Goal Achievement: 05/20/21 ?Potential to Achieve Goals: Good ?Progress towards PT goals: Progressing toward goals ? ?  ?Frequency ? ? ? 7X/week ? ? ? ?  ?PT Plan Current plan remains appropriate  ? ? ?Co-evaluation   ?  ?  ?  ?  ? ?  ?AM-PAC PT "6 Clicks" Mobility   ?Outcome Measure ? Help needed turning from your back to your side while in a flat bed without using bedrails?: None ?Help needed moving from lying on your back to sitting on the side of a flat bed without using bedrails?: None ?Help needed moving to and from a bed to a chair (including a wheelchair)?: None ?Help needed standing up from a chair using your arms (e.g., wheelchair or bedside chair)?: A Little ?Help needed to walk in hospital room?: A Little ?Help needed climbing 3-5 steps with a railing? : A Little ?6 Click Score: 21 ? ?  ?End of Session   ?Activity Tolerance: Patient tolerated treatment well ?Patient left: in bed;with call bell/phone within reach ?Nurse Communication: Mobility status ?PT Visit Diagnosis: Unsteadiness on feet (R26.81);Muscle weakness (generalized) (M62.81);Pain ?Pain - Right/Left: Right ?Pain - part of body: Hip ?  ? ? ?Time: [phone] ?PT Time Calculation (min) (ACUTE ONLY): 12 min ? ?Charges:  $Gait Training: 8-22 mins          ?          ? ?Lorie Apley, SPT ?Acute Rehab Services ? ? ? ?Lorie Apley ?05/07/2021, 12:17  PM ? ?

## 2021-05-07 NOTE — TOC Transition Note (Signed)
Transition of Care (TOC) - CM/SW Discharge Note ? ? ?Patient Details  ?Name: Christie Fitzpatrick ?MRN: [id number] ?Date of Birth: February 02, 1956 ? ?Transition of Care [facility] [place]) CM/SW Contact:  ?Sharin Mons, RN ?Phone Number: ?05/07/2021, 10:44 AM ? ? ?Clinical Narrative:    ?Patient will DC to: home ?Anticipated DC date: 05/07/2021 ?Family notified: yes ?Transport by: car ? ?- s/p total replacement of right hip, 3/9 ?  ?Per MD patient ready for DC today. RN, patient and  patient's family aware of d/c . Order noted for home health services. Pt agreeable. Pt without preference. Referral made with [facility] and accepted. Pt without DME needs. States already has @ home, RW and BSC. ? ?Post hospital follow up noted on AVS. ? ?Pt without Rx med concerns. ?Daughter to provide transportation to home. ? ?RNCM will sign off for now as intervention is no longer needed. Please consult [place] again if new needs arise.  ? ?Final next level of care: [place] ?Barriers to Discharge: No Barriers Identified ? ? ?Patient Goals and CMS Choice ?  ?  ?Choice offered to / list presented to : Patient ? ?Discharge Placement ?   ?Discharge Plan and Services ?  ?  ?HH Arranged: PT ?[place] Agency: [place] ?Date HH Agency Contacted: 04/29/21 ?Time [place]: [id number] ?Representative spoke with at [place]: Tommi Rumps ? ?Social Determinants of Health (SDOH) Interventions ?  ? ? ?Readmission Risk Interventions ?No flowsheet data found. ? ? ? ? ?

## 2021-05-07 NOTE — Plan of Care (Signed)

## 2021-05-07 NOTE — Progress Notes (Signed)
Physical Therapy Treatment ?Patient Details ?Name: Christie Fitzpatrick ?MRN: [id number] ?DOB: [birth year] ?Today's Date: 05/07/2021 ? ? ?History of Present Illness Pt is a 66 yo F presenting s/p R THA, direct anterior approach. PMH includes R elbow fracture and surgery (1979). ? ?  ?PT Comments  ? ? Pt seen for second session today to complete education. Focus of session today longer distance ambulation, stair, and HEP training. The patient tolerated well and reports no increase in pain.  Pt. Shows overall improvement with her ambulation capacity, functional transfer ability, and stair navigation. Overall pain, R LE strength, mobility, and balance are still limiting function. SPT to sign off given supervision level mobility, completed education, and HEP training. RN was notified to contact PA about pt concerns for pain management once d/c to home. Plan and discharge setting remains unchanged. Please re-consult if there is any change to functional mobility.  ?  ?Recommendations for follow up therapy are one component of a multi-disciplinary discharge planning process, led by the attending physician.  Recommendations may be updated based on patient status, additional functional criteria and insurance authorization. ? ?Follow Up Recommendations ? Follow physician's recommendations for discharge plan and follow up therapies ?  ?  ?Assistance Recommended at Discharge PRN  ?Patient can return home with the following A little help with walking and/or transfers ?  ?Equipment Recommendations ? None recommended by PT  ?  ?Recommendations for Other Services   ? ? ?  ?Precautions / Restrictions Precautions ?Precautions: None ?Restrictions ?Weight Bearing Restrictions: Yes ?RLE Weight Bearing: Weight bearing as tolerated  ?  ? ?Mobility ? Bed Mobility ?Overal bed mobility: Modified Independent ?Bed Mobility: Supine to Sit ?  ?  ?Supine to sit: Modified independent (Device/Increase time) ?  ?  ?General bed mobility comments: Cues for  sequencing and use of L LE to help support R LE during transfers ?  ? ?Transfers ?Overall transfer level: Modified independent ?  ?Transfers: Sit to/from Stand ?Sit to Stand: Modified independent (Device/Increase time) ?  ?  ?  ?  ?  ?General transfer comment: Pt. reports taking a shower and meets PT at door of the room ?  ? ?Ambulation/Gait ?Ambulation/Gait assistance: Modified independent (Device/Increase time) ?Gait Distance (Feet): 300 Feet ?Assistive device: Rolling walker (2 wheels) ?Gait Pattern/deviations: Step-through pattern, Decreased step length - right, Decreased stance time - right ?Gait velocity: WFL ?  ?  ?General Gait Details: Pt. shows WFL gait speed, slight guarding of R LE and decreased WB to that side. Minimally reliant on RW during gait. ? ? ?Stairs ?Stairs: Yes ?Stairs assistance: Supervision ?Stair Management: One rail Right, Forwards ?Number of Stairs: 13 ?General stair comments: Pt. ambulates step to pattern on ascend and decend using R sided rail on way up and L on way down. ? ? ?Wheelchair Mobility ?  ? ?Modified Rankin (Stroke Patients Only) ?  ? ? ?  ?Balance Overall balance assessment: Modified Independent ?  ?Sitting balance-Leahy Scale: Good ?  ?  ?Standing balance support: No upper extremity supported ?Standing balance-Leahy Scale: Good ?Standing balance comment: Pt. reports taking a shower by herself and shows good standing balance while using RW. ?  ?  ?  ?  ?  ?  ?  ?  ?  ?  ?  ?  ? ?  ?Cognition Arousal/Alertness: Awake/alert ?Behavior During Therapy: [facility] for tasks assessed/performed ?Overall Cognitive Status: Within Functional Limits for tasks assessed ?  ?  ?  ?  ?  ?  ?  ?  ?  ?  ?  ?  ?  ?  ?  ?  ?  ?  ?  ? ?  ?  Exercises Total Joint Exercises ?Ankle Circles/Pumps: AROM, Both, 10 reps ?Quad Sets: Right, 5 reps, Strengthening, Supine ?Short Arc Quad: Strengthening, Right, 5 reps ?Heel Slides: Strengthening, Right, 5 reps ?Hip ABduction/ADduction: Strengthening, Right, 5  reps ?Long Arc Quad: Strengthening, Right, 5 reps ? ?  ?General Comments   ?  ?  ? ?Pertinent Vitals/Pain Pain Assessment ?Pain Assessment: 0-10 ?Pain Score: 6  ?Pain Location: R hip operative site ?Pain Descriptors / Indicators: Grimacing, Discomfort ?Pain Intervention(s): Limited activity within patient's tolerance, Monitored during session, Premedicated before session  ? ? ?Home Living   ?  ?  ?  ?  ?  ?  ?  ?  ?  ?   ?  ?Prior Function    ?  ?  ?   ? ?PT Goals (current goals can now be found in the care plan section) Acute Rehab PT Goals ?Patient Stated Goal: to go home and climb steps to her office ?PT Goal Formulation: With patient ?Time For Goal Achievement: 05/08/21 ?Potential to Achieve Goals: Good ?Progress towards PT goals: Progressing toward goals ? ?  ?Frequency ? ? ?  (PT to sign off given supervision level mobility) ? ? ? ?  ?PT Plan Frequency needs to be updated  ? ? ?Co-evaluation   ?  ?  ?  ?  ? ?  ?AM-PAC PT "6 Clicks" Mobility   ?Outcome Measure ? Help needed turning from your back to your side while in a flat bed without using bedrails?: None ?Help needed moving from lying on your back to sitting on the side of a flat bed without using bedrails?: None ?Help needed moving to and from a bed to a chair (including a wheelchair)?: None ?Help needed standing up from a chair using your arms (e.g., wheelchair or bedside chair)?: None ?Help needed to walk in hospital room?: None ?Help needed climbing 3-5 steps with a railing? : A Little ?6 Click Score: 23 ? ?  ?End of Session   ?Activity Tolerance: Patient tolerated treatment well ?Patient left: in bed;with call bell/phone within reach ?Nurse Communication: Mobility status (Questions about d/c planning and pain management) ?PT Visit Diagnosis: Unsteadiness on feet (R26.81);Muscle weakness (generalized) (M62.81);Pain ?Pain - Right/Left: Right ?Pain - part of body: Hip ?  ? ? ?Time: 6606-3016 ?PT Time Calculation (min) (ACUTE ONLY): 23 min ? ?Charges:   $Gait Training: 8-22 mins ?$Therapeutic Exercise: 8-22 mins          ?          ? ?Lorie Apley, SPT ?Acute Rehab Services ? ? ? ?Lorie Apley ?05/07/2021, 2:52 PM ? ?

## 2021-05-07 NOTE — Discharge Summary (Signed)
?Patient ID: ?Christie BrazenSally Fitzpatrick ?MRN: [id number] ?DOB/AGE: 1955/03/24 66 y.o. ? ?Admit date: 05/06/2021 ?Discharge date: 05/07/2021 ? ?Admission Diagnoses:  ?Principal Problem: ?  Unilateral primary osteoarthritis, right hip ?Active Problems: ?  Status post total replacement of right hip ? ? ?Discharge Diagnoses:  ?Same ? ?History reviewed. No pertinent past medical history. ? ?Surgeries: Procedure(s): ?Right TOTAL HIP ARTHROPLASTY ANTERIOR APPROACH on 05/06/2021 ?  ?Consultants:  ? ?Discharged Condition: Improved ? ?Hospital Course: Christie BrazenSally Fitzpatrick is an 66 y.o. female who was admitted 05/06/2021 for operative treatment ofUnilateral primary osteoarthritis, right hip. Patient has severe unremitting pain that affects sleep, daily activities, and work/hobbies. After pre-op clearance the patient was taken to the operating room on 05/06/2021 and underwent  Procedure(s): ?Right TOTAL HIP ARTHROPLASTY ANTERIOR APPROACH.   ? ?Patient was given perioperative antibiotics:  ?Anti-infectives (From admission, onward)  ? ? Start     Dose/Rate Route Frequency Ordered Stop  ? 05/06/21 1515  ceFAZolin (ANCEF) IVPB 1 g/50 mL premix       ? 1 g ?100 mL/hr over 30 Minutes Intravenous Every 6 hours 05/06/21 1427 05/06/21 2108  ? 05/06/21 0600  ceFAZolin (ANCEF) IVPB 2g/100 mL premix       ? 2 g ?200 mL/hr over 30 Minutes Intravenous On call to O.R. 05/06/21 0546 05/06/21 0745  ? ?  ?  ? ?Patient was given sequential compression devices, early ambulation, and chemoprophylaxis to prevent DVT. ? ?Patient benefited maximally from hospital stay and there were no complications.   ? ?Recent vital signs: Patient Vitals for the past 24 hrs: ? BP Temp Temp src Pulse Resp SpO2  ?05/07/21 0646 (!) 92/56 98 ?F (36.7 ?C) Oral 67 16 99 %  ?05/06/21 2229 99/60 98.9 ?F (37.2 ?C) Oral 63 16 97 %  ?05/06/21 1441 111/80 98.6 ?F (37 ?C) Oral 64 18 100 %  ?05/06/21 1411 103/70 98.4 ?F (36.9 ?C) -- 63 16 100 %  ?05/06/21 1356 102/69 -- -- (!) 59 12 99 %  ?05/06/21 1342  103/70 -- -- (!) 57 14 99 %  ?05/06/21 1326 100/76 -- -- (!) 57 15 100 %  ?05/06/21 1311 101/61 -- -- (!) 59 11 98 %  ?05/06/21 1256 101/65 -- -- (!) 55 10 98 %  ?05/06/21 1241 106/63 -- -- (!) 56 11 96 %  ?05/06/21 1226 104/65 -- -- (!) 54 13 100 %  ?05/06/21 1211 110/73 -- -- (!) 58 13 100 %  ?05/06/21 1156 112/66 -- -- 63 15 100 %  ?05/06/21 1141 111/76 -- -- 61 13 100 %  ?05/06/21 1111 106/71 -- -- (!) 55 12 100 %  ?05/06/21 1056 111/68 -- -- (!) 54 11 100 %  ?05/06/21 1041 107/67 -- -- (!) 50 12 100 %  ?05/06/21 1026 100/66 -- -- (!) 47 11 100 %  ?05/06/21 1011 105/75 -- -- (!) 53 15 98 %  ?05/06/21 0957 101/77 -- -- (!) 54 14 100 %  ?05/06/21 0941 103/66 -- -- (!) 50 17 100 %  ?05/06/21 0926 (!) 97/54 -- -- (!) 51 15 100 %  ?05/06/21 0911 94/65 (!) 97.4 ?F (36.3 ?C) -- 66 13 99 %  ?  ? ?Recent laboratory studies:  ?Recent Labs  ?  05/07/21 ?0319  ?WBC 7.9  ?HGB 9.9*  ?HCT 29.7*  ?PLT 161  ?NA 135  ?K 4.1  ?CL 104  ?CO2 26  ?BUN 10  ?CREATININE 0.72  ?GLUCOSE 138*  ?CALCIUM 8.8*  ? ? ? ?Discharge Medications:   ?  Allergies as of 05/07/2021   ?No Known Allergies ?  ? ?  ?Medication List  ?  ? ?TAKE these medications   ? ?acetaminophen 500 MG tablet ?Commonly known as: TYLENOL ?Take 1,000 mg by mouth 3 (three) times daily. ?  ?aspirin 81 MG chewable tablet ?Chew 1 tablet (81 mg total) by mouth 2 (two) times daily. ?  ?ibuprofen 200 MG tablet ?Commonly known as: ADVIL ?Take 400 mg by mouth 3 (three) times daily. ?  ?Magnesium Gluconate Powd ?Take 5 mLs by mouth daily. Calm ?  ?methocarbamol 500 MG tablet ?Commonly known as: ROBAXIN ?Take 1 tablet (500 mg total) by mouth every 6 (six) hours as needed for muscle spasms. ?  ?oxyCODONE 5 MG immediate release tablet ?Commonly known as: Oxy IR/ROXICODONE ?Take 1-2 tablets (5-10 mg total) by mouth every 4 (four) hours as needed for moderate pain (pain score 4-6). ?  ? ?  ? ?  ?  ? ? ?  ?Durable Medical Equipment  ?(From admission, onward)  ?  ? ? ?  ? ?  Start     Ordered   ? 05/06/21 1428  DME 3 n 1  Once       ? 05/06/21 1427  ? 05/06/21 1428  DME Walker rolling  Once       ?Question Answer Comment  ?Walker: With 5 Inch Wheels   ?Patient needs a walker to treat with the following condition Status post total replacement of right hip   ?  ? 05/06/21 1427  ? ?  ?  ? ?  ? ? ?Diagnostic Studies: DG Pelvis Portable ? ?Result Date: 05/06/2021 ?CLINICAL DATA:  Status post right hip replacement. EXAM: PORTABLE PELVIS 1-2 VIEWS COMPARISON:  Intraoperative fluoroscopic images 05/06/2021. Right hip radiographs 11/17/2020. FINDINGS: Sequelae of right total hip arthroplasty are identified. The prosthetic components appear normally positioned on this single AP image. Postoperative gas is noted in the adjacent soft tissues. No acute fracture is identified. Phleboliths are noted in the pelvis. IMPRESSION: Right total hip arthroplasty without evidence of acute complication. Electronically Signed   By: Sebastian Ache M.D.   On: 05/06/2021 09:45  ? ?DG HIP UNILAT WITH PELVIS 2-3 VIEWS RIGHT ? ?Result Date: 05/06/2021 ?CLINICAL DATA:  Right hip replacement. EXAM: DG HIP (WITH OR WITHOUT PELVIS) 2-3V RIGHT COMPARISON:  Right hip x-rays dated November 17, 2020. FLUOROSCOPY TIME:  Radiation Exposure Index (as provided by the fluoroscopic device): 2.54 mGy Kerma C-arm fluoroscopic images were obtained intraoperatively and submitted for post operative interpretation. FINDINGS: Multiple intraoperative fluoroscopic images demonstrate interval right total hip arthroplasty. Components are well aligned. No acute osseous abnormality. Grossly normal left hip. IMPRESSION: 1. Intraoperative fluoroscopic guidance for right total hip arthroplasty. Electronically Signed   By: Obie Dredge M.D.   On: 05/06/2021 09:09   ? ?Disposition: Discharge disposition: 01-Home or Self Care ? ? ? ? ? ? ? ? ? Follow-up Information   ? ? Kathryne Hitch, MD Follow up in 2 week(s).   ?Specialty: Orthopedic Surgery ?Contact  information: ?[address] ?[place] [address] ?[phone] ? ? ?  ?  ? ? Care, [facility] Follow up.   ?Specialty: Home Health Services ?Why: [facility] will provide your home health PT services, start of care with 48 hours post discharge. ?Contact information: ?[address] ?[address] ?[place] [address] ?[phone] ? ? ?  ?  ? ?  ?  ? ?  ? ? ? ?Signed: ?Vanita Panda  Magnus Ivan ?05/07/2021, 8:42 AM ? ? ? ?

## 2021-05-12 ENCOUNTER — Telehealth: Payer: Self-pay

## 2021-05-12 ENCOUNTER — Other Ambulatory Visit: Payer: Self-pay | Admitting: Physician Assistant

## 2021-05-12 MED ORDER — TRAMADOL HCL 50 MG PO TABS: 50.0000 mg | ORAL_TABLET | Freq: Four times a day (QID) | ORAL | 0 refills | Status: DC | PRN

## 2021-05-12 NOTE — Telephone Encounter (Signed)
I called and talked to the pt. She stated she is unsure which pain medication caused this but she stated she has had an allergic reaction with bumps to one of them. She has stopped both her dilaudid and muscle relaxer and wants something else for pain. After talking to the pt further, she would like to try tramadol at night to help her sleep. Please send medication into the [facility].  ?

## 2021-05-12 NOTE — Telephone Encounter (Signed)
Patient called and would like a call back regarding pain management and some medications she would like to change.  ? ? ?

## 2021-05-20 ENCOUNTER — Encounter: Payer: Self-pay | Admitting: Orthopaedic Surgery

## 2021-05-20 ENCOUNTER — Ambulatory Visit (INDEPENDENT_AMBULATORY_CARE_PROVIDER_SITE_OTHER): Payer: BC Managed Care – PPO | Admitting: Orthopaedic Surgery

## 2021-05-20 DIAGNOSIS — Z96641 Presence of right artificial hip joint: Secondary | ICD-10-CM

## 2021-05-20 NOTE — Progress Notes (Signed)
Christie Fitzpatrick is almost 2 weeks status post a right total hip arthroplasty.  She is doing very well but has some soreness last few days after doing a lot of walking.  Home therapy never came to her house but she is already walking without any assistive device.  She is a very thin and active 66 year old female. ? ?Her right hip incision looks good.  There is a mild seroma and only aspirated about 15 cc of fluid off of this area.  I gave her reassurance that from my standpoint things are looking good.  Her likely surgical. ? ?She will continue to slowly increase her activities as comfort allows.  I want her to wait 2 more weeks before submerging underwater.  I would like to see her back in a month to see how she is doing overall but no x-rays are needed.  If she is having significant pain we can x-ray her hip. ?

## 2021-05-25 ENCOUNTER — Other Ambulatory Visit: Payer: Self-pay | Admitting: Physician Assistant

## 2021-05-25 MED ORDER — TRAMADOL HCL 50 MG PO TABS: 50.0000 mg | ORAL_TABLET | Freq: Four times a day (QID) | ORAL | 0 refills | Status: DC | PRN

## 2021-06-07 DIAGNOSIS — L814 Other melanin hyperpigmentation: Secondary | ICD-10-CM | POA: Diagnosis not present

## 2021-06-07 DIAGNOSIS — L570 Actinic keratosis: Secondary | ICD-10-CM | POA: Diagnosis not present

## 2021-06-07 DIAGNOSIS — L578 Other skin changes due to chronic exposure to nonionizing radiation: Secondary | ICD-10-CM | POA: Diagnosis not present

## 2021-06-07 DIAGNOSIS — L821 Other seborrheic keratosis: Secondary | ICD-10-CM | POA: Diagnosis not present

## 2021-06-17 ENCOUNTER — Encounter: Payer: BC Managed Care – PPO | Admitting: Orthopaedic Surgery

## 2021-06-17 ENCOUNTER — Telehealth: Payer: Self-pay | Admitting: Orthopaedic Surgery

## 2021-06-17 ENCOUNTER — Other Ambulatory Visit: Payer: Self-pay

## 2021-06-17 DIAGNOSIS — Z96641 Presence of right artificial hip joint: Secondary | ICD-10-CM

## 2021-06-17 NOTE — Telephone Encounter (Signed)
Pt states that she has a lot of questions. She said she needs someone to call her. ? ?CB [phone]  ?

## 2021-06-17 NOTE — Telephone Encounter (Signed)
Rescheduled appt and sent referral to PT per her request  ?

## 2021-06-17 NOTE — Telephone Encounter (Signed)
Pt called and is wondering if she can do her appt virtual because her husband has covid.  ? ?CB [phone]  ?

## 2021-06-24 ENCOUNTER — Ambulatory Visit: Payer: BC Managed Care – PPO | Admitting: Physical Therapy

## 2021-06-28 ENCOUNTER — Encounter: Payer: BC Managed Care – PPO | Admitting: Orthopaedic Surgery

## 2021-06-29 ENCOUNTER — Telehealth: Payer: Self-pay | Admitting: Orthopaedic Surgery

## 2021-06-29 NOTE — Telephone Encounter (Signed)
Pt is healing per her phone call, missed the appt on 06-28-21 as she was asleep --she has covid . ? ?Will make an appt when symptom free  ?

## 2021-07-01 ENCOUNTER — Encounter: Payer: Self-pay | Admitting: Physical Therapy

## 2021-07-01 ENCOUNTER — Ambulatory Visit (INDEPENDENT_AMBULATORY_CARE_PROVIDER_SITE_OTHER): Payer: BC Managed Care – PPO | Admitting: Physical Therapy

## 2021-07-01 DIAGNOSIS — R2689 Other abnormalities of gait and mobility: Secondary | ICD-10-CM | POA: Diagnosis not present

## 2021-07-01 DIAGNOSIS — M25552 Pain in left hip: Secondary | ICD-10-CM | POA: Diagnosis not present

## 2021-07-01 DIAGNOSIS — M25652 Stiffness of left hip, not elsewhere classified: Secondary | ICD-10-CM

## 2021-07-01 DIAGNOSIS — M6281 Muscle weakness (generalized): Secondary | ICD-10-CM | POA: Diagnosis not present

## 2021-07-01 NOTE — Therapy (Addendum)
OUTPATIENT PHYSICAL THERAPY LOWER EXTREMITY EVALUATION DISCHARGE SUMMARY   Patient Name: Christie Fitzpatrick MRN: [id number] DOB:1955/08/20, 66 y.o., female Today's Date: 07/01/2021   PT End of Session - 07/01/21 1236     Visit Number 1    Number of Visits 3    Date for PT Re-Evaluation 08/12/21    Authorization Type [employer] OOP Met    PT Start Time 1142    PT Stop Time 1218    PT Time Calculation (min) 36 min    Activity Tolerance Patient tolerated treatment well    Behavior During Therapy [facility] for tasks assessed/performed             History reviewed. No pertinent past medical history. Past Surgical History:  Procedure Laterality Date   ELBOW SURGERY Right 1978   dislocated elbow with fractured radius   TONSILLECTOMY     removed as a child   TOTAL HIP ARTHROPLASTY Right 05/06/2021   Procedure: Right TOTAL HIP ARTHROPLASTY ANTERIOR APPROACH;  Surgeon: Mcarthur Rossetti, MD;  Location: [place];  Service: Orthopedics;  Laterality: Right;   [place] EXTRACTION  1973   Patient Active Problem List   Diagnosis Date Noted   Unilateral primary osteoarthritis, right hip 05/06/2021   Status post total replacement of right hip 05/06/2021   Chronic right hip pain 11/17/2020   Non-insertional Achilles tendinopathy 11/17/2020    PCP: Josetta Huddle, MD  REFERRING PROVIDER: Mcarthur Rossetti, MD   REFERRING DIAG: [phone] (ICD-10-CM) - Status post total replacement of right hip   THERAPY DIAG:  Pain in left hip - Plan: PT plan of care cert/re-cert  Stiffness of left hip, not elsewhere classified - Plan: PT plan of care cert/re-cert  Muscle weakness (generalized) - Plan: PT plan of care cert/re-cert  Other abnormalities of gait and mobility - Plan: PT plan of care cert/re-cert  ONSET DATE: 07/30/24  SUBJECTIVE:   SUBJECTIVE STATEMENT: Pt is a 66 y/o female who presents to OPPT s/p Rt THA on 05/06/21.  She reports persistent tightness and unable to perform yoga at this  time.  PERTINENT HISTORY: None  PAIN:  Are you having pain? Yes: NPRS scale: "sore"/10 Pain location: Rt SIJ, ant shin Pain description: soreness Aggravating factors: deep squat Relieving factors: avoiding position  PRECAUTIONS: None  WEIGHT BEARING RESTRICTIONS No  FALLS:  Has patient fallen in last 6 months? No  LIVING ENVIRONMENT: Lives with: lives with their spouse and daughter Lives in: House/apartment Stairs: Yes: Internal: 14 steps; on right going up and External: 3 steps; none denies difficulty with stairs Has following equipment at home: None  OCCUPATION: Passenger transport manager (self-employed)  PLOF: Independent and Leisure: yoga, mountain biking, swimming, gardening, hiking, kayaking, used to run a lot - doesn't plan to go back  PATIENT GOALS: decrease pain, return to regular activities   OBJECTIVE:   PATIENT SURVEYS:  07/01/21 FOTO 75 (predicted 62)  COGNITION:  Overall cognitive status: Within functional limits for tasks assessed    SENSATION: WFL  MUSCLE LENGTH: 07/01/21: piriformis tightness noted on Rt  PALPATION: 07/01/21: tenderness along gastroc and ant tib on Rt - discussed self STM to area and possible over use from gait changes following surgery.  Anticipate this will improve as gait continues to normalize.  Also with pain/tenderness at piriformis origin along sacrum.  LE ROM: 07/01/21 bil hip AROM WNL; pt reports tightness with Rt hip motion but no significant deficits noted   LE MMT:  MMT Right 07/01/2021 Left 07/01/2021  Hip  flexion 4/5 5/5  Hip extension 3+/5 4/5  Hip abduction 3+/5 5/5  Hip adduction    Hip internal rotation 4/5 5/5  Hip external rotation 3/5 4/5   (Blank rows = not tested)   GAIT: Distance walked: 100' Assistive device utilized: None Level of assistance: Complete Independence Comments: mild trendelenburg noted on Rt    TODAY'S TREATMENT: 07/01/21: see HEP - reviewed and performed trial reps PRN for comprehension;  discussed return to yoga and starting with modifications PRN working into postures as able.  Self STM with rolling pin and tennis ball to ant tib and piriformis to help with tightness.   PATIENT EDUCATION:  Education details: HEP Person educated: Patient Education method: Explanation, Demonstration, and Handouts Education comprehension: verbalized understanding and returned demonstration   HOME EXERCISE PROGRAM: Access Code: [email or web address] URL: [email or web address] Date: 07/01/2021 Prepared by: Faustino Congress  Exercises - Supine Figure 4 Piriformis Stretch  - 2 x daily - 7 x weekly - 1 sets - 3 reps - 30 sec hold - Supine Piriformis Stretch with Foot on Ground  - 2 x daily - 7 x weekly - 1 sets - 3 reps - 30 sec hold - Standing Gastroc Stretch  - 2 x daily - 7 x weekly - 1 sets - 3 reps - 30 sec hold - Standing Soleus Stretch  - 2 x daily - 7 x weekly - 1 sets - 3 reps - 30 sec hold - Single Leg Bridge  - 2 x daily - 7 x weekly - 1-2 sets - 10 reps - 5 sec hold - Standing Hip Hiking  - 2 x daily - 7 x weekly - 1-2 sets - 10 reps - 5 sec hold  ASSESSMENT:  CLINICAL IMPRESSION: Patient is a 66 y.o. female who was seen today for physical therapy evaluation and treatment s/p Rt THA on 05/06/21.  She demonstrates decreased flexibility and strength, as well as some mild post-op pain affecting functional mobility.  Pt will benefit from PT to address deficits listed.    OBJECTIVE IMPAIRMENTS Abnormal gait, decreased activity tolerance, decreased strength, hypomobility, increased fascial restrictions, and pain.   ACTIVITY LIMITATIONS community activity, occupation, and fitness .   PERSONAL FACTORS  None    REHAB POTENTIAL: Excellent  CLINICAL DECISION MAKING: Stable/uncomplicated  EVALUATION COMPLEXITY: Low   GOALS: Goals reviewed with patient? Yes  SHORT TERM GOALS: Target date: 07/22/21  Independent with initial HEP Goal status: INITIAL   LONG TERM GOALS: Target  date: 08/12/2021  Independent with final HEP Goal status: INITIAL  2.  FOTO score improved to 78 Goal status: INITIAL  3.  Rt hip strength improved to 4+/5 for improved function and mobility Goal status: INIITAL  4.  Report pain < 2/10 with yoga activities for improved function Goal status: INITIAL   PLAN: PT FREQUENCY: 2x/month  PT DURATION: 6 weeks  PLANNED INTERVENTIONS: Therapeutic exercises, Therapeutic activity, Neuromuscular re-education, Gait training, Patient/Family education, Joint mobilization, Aquatic Therapy, Dry Needling, Electrical stimulation, Cryotherapy, Moist heat, Taping, and Manual therapy  PLAN FOR NEXT SESSION: review HEP, progress hip strengthening exercises   Laureen Abrahams, PT, DPT 07/01/21 12:38 PM    PHYSICAL THERAPY DISCHARGE SUMMARY  Visits from Start of Care: 1  Current functional level related to goals / functional outcomes: See above; spoke to pt who reports she was doing well and did not feel like she needed to come back.   Remaining deficits: See above   Education / Equipment: HEP   Patient  agrees to discharge. Patient goals were not met. Patient is being discharged due to being pleased with the current functional level.    Laureen Abrahams, PT, DPT 08/16/21 10:42 AM  [facility] [address] [place], [place], [address] Phone: [phone]   Fax:  [phone]

## 2021-07-12 ENCOUNTER — Encounter: Payer: BC Managed Care – PPO | Admitting: Orthopaedic Surgery

## 2021-07-14 ENCOUNTER — Encounter: Payer: BC Managed Care – PPO | Admitting: Orthopaedic Surgery

## 2021-07-19 ENCOUNTER — Encounter: Payer: BC Managed Care – PPO | Admitting: Physical Therapy

## 2021-07-19 ENCOUNTER — Telehealth: Payer: Self-pay | Admitting: Physical Therapy

## 2021-07-19 NOTE — Telephone Encounter (Signed)
Spoke with pt who did not show for scheduled PT appt.  She reports she tried to call and left messages to cancel her appt today.  She is doing well and does not need any more scheduled PT.   Clarita Crane, PT, DPT 07/19/21 1:19 PM

## 2021-07-19 NOTE — Therapy (Incomplete)
OUTPATIENT PHYSICAL THERAPY TREATMENT NOTE   Patient Name: Christie Fitzpatrick MRN: [id number] DOB:Jul 07, 1955, 66 y.o., female Today's Date: 07/19/2021  PCP: Marden Noble, MD REFERRING PROVIDER: Kathryne Hitch, MD   END OF SESSION:    No past medical history on file. Past Surgical History:  Procedure Laterality Date   ELBOW SURGERY Right 1978   dislocated elbow with fractured radius   TONSILLECTOMY     removed as a child   TOTAL HIP ARTHROPLASTY Right 05/06/2021   Procedure: Right TOTAL HIP ARTHROPLASTY ANTERIOR APPROACH;  Surgeon: Kathryne Hitch, MD;  Location: [facility];  Service: Orthopedics;  Laterality: Right;   WISDOM TOOTH EXTRACTION  1973   Patient Active Problem List   Diagnosis Date Noted   Unilateral primary osteoarthritis, right hip 05/06/2021   Status post total replacement of right hip 05/06/2021   Chronic right hip pain 11/17/2020   Non-insertional Achilles tendinopathy 11/17/2020    REFERRING DIAG: Z96.641 (ICD-10-CM) - Status post total replacement of right hip   THERAPY DIAG:  No diagnosis found.  Rationale for Evaluation and Treatment Rehabilitation  PERTINENT HISTORY: None  PRECAUTIONS: None  SUBJECTIVE: ***  PAIN:  Are you having pain? {OPRCPAIN:[id number]}   OBJECTIVE: (objective measures completed at initial evaluation unless otherwise dated)   PATIENT SURVEYS:  07/01/21 FOTO 75 (predicted 78)    MUSCLE LENGTH: 07/01/21: piriformis tightness noted on Rt   PALPATION: 07/01/21: tenderness along gastroc and ant tib on Rt - discussed self STM to area and possible over use from gait changes following surgery.  Anticipate this will improve as gait continues to normalize.  Also with pain/tenderness at piriformis origin along sacrum.   LE ROM: 07/01/21 bil hip AROM WNL; pt reports tightness with Rt hip motion but no significant deficits noted     LE MMT:   MMT Right 07/01/2021 Left 07/01/2021  Hip flexion 4/5 5/5  Hip extension 3+/5 4/5  Hip  abduction 3+/5 5/5  Hip adduction      Hip internal rotation 4/5 5/5  Hip external rotation 3/5 4/5   (Blank rows = not tested)     GAIT: Distance walked: 100' Assistive device utilized: None Level of assistance: Complete Independence Comments: mild trendelenburg noted on Rt       TODAY'S TREATMENT: 07/19/21: ***  07/01/21: see HEP - reviewed and performed trial reps PRN for comprehension; discussed return to yoga and starting with modifications PRN working into postures as able.  Self STM with rolling pin and tennis ball to ant tib and piriformis to help with tightness.     PATIENT EDUCATION:  Education details: HEP Person educated: Patient Education method: Explanation, Demonstration, and Handouts Education comprehension: verbalized understanding and returned demonstration     HOME EXERCISE PROGRAM: Access Code: [email or web address] URL: [email or web address] Date: 07/01/2021 Prepared by: Moshe Cipro   Exercises - Supine Figure 4 Piriformis Stretch  - 2 x daily - 7 x weekly - 1 sets - 3 reps - 30 sec hold - Supine Piriformis Stretch with Foot on Ground  - 2 x daily - 7 x weekly - 1 sets - 3 reps - 30 sec hold - Standing Gastroc Stretch  - 2 x daily - 7 x weekly - 1 sets - 3 reps - 30 sec hold - Standing Soleus Stretch  - 2 x daily - 7 x weekly - 1 sets - 3 reps - 30 sec hold - Single Leg Bridge  - 2 x daily - 7 x weekly - 1-2 sets -  10 reps - 5 sec hold - Standing Hip Hiking  - 2 x daily - 7 x weekly - 1-2 sets - 10 reps - 5 sec hold   ASSESSMENT:   CLINICAL IMPRESSION: *** Patient is a 66 y.o. female who was seen today for physical therapy evaluation and treatment s/p Rt THA on 05/06/21.  She demonstrates decreased flexibility and strength, as well as some mild post-op pain affecting functional mobility.  Pt will benefit from PT to address deficits listed.      OBJECTIVE IMPAIRMENTS Abnormal gait, decreased activity tolerance, decreased strength, hypomobility,  increased fascial restrictions, and pain.    ACTIVITY LIMITATIONS community activity, occupation, and fitness .    PERSONAL FACTORS  None     REHAB POTENTIAL: Excellent   CLINICAL DECISION MAKING: Stable/uncomplicated   EVALUATION COMPLEXITY: Low     GOALS: Goals reviewed with patient? Yes   SHORT TERM GOALS: Target date: 07/22/21   Independent with initial HEP Goal status: INITIAL     LONG TERM GOALS: Target date: 08/12/2021   Independent with final HEP Goal status: INITIAL   2.  FOTO score improved to 78 Goal status: INITIAL   3.  Rt hip strength improved to 4+/5 for improved function and mobility Goal status: INIITAL   4.  Report pain < 2/10 with yoga activities for improved function Goal status: INITIAL     PLAN: PT FREQUENCY: 2x/month   PT DURATION: 6 weeks   PLANNED INTERVENTIONS: Therapeutic exercises, Therapeutic activity, Neuromuscular re-education, Gait training, Patient/Family education, Joint mobilization, Aquatic Therapy, Dry Needling, Electrical stimulation, Cryotherapy, Moist heat, Taping, and Manual therapy   PLAN FOR NEXT SESSION: *** review HEP, progress hip strengthening exercises   Moshe Cipro, PT 07/19/2021, 12:55 PM

## 2021-09-17 ENCOUNTER — Institutional Professional Consult (permissible substitution): Payer: BC Managed Care – PPO | Admitting: Plastic Surgery

## 2021-09-18 ENCOUNTER — Institutional Professional Consult (permissible substitution): Payer: BC Managed Care – PPO | Admitting: Plastic Surgery

## 2021-10-12 ENCOUNTER — Institutional Professional Consult (permissible substitution): Payer: BC Managed Care – PPO | Admitting: Plastic Surgery

## 2021-11-25 DIAGNOSIS — Z01419 Encounter for gynecological examination (general) (routine) without abnormal findings: Secondary | ICD-10-CM | POA: Diagnosis not present

## 2021-11-25 DIAGNOSIS — Z1231 Encounter for screening mammogram for malignant neoplasm of breast: Secondary | ICD-10-CM | POA: Diagnosis not present

## 2021-11-25 DIAGNOSIS — Z1151 Encounter for screening for human papillomavirus (HPV): Secondary | ICD-10-CM | POA: Diagnosis not present

## 2021-11-25 DIAGNOSIS — Z1389 Encounter for screening for other disorder: Secondary | ICD-10-CM | POA: Diagnosis not present

## 2021-11-25 DIAGNOSIS — Z124 Encounter for screening for malignant neoplasm of cervix: Secondary | ICD-10-CM | POA: Diagnosis not present

## 2021-12-20 DIAGNOSIS — D259 Leiomyoma of uterus, unspecified: Secondary | ICD-10-CM | POA: Diagnosis not present

## 2021-12-21 DIAGNOSIS — K625 Hemorrhage of anus and rectum: Secondary | ICD-10-CM | POA: Diagnosis not present

## 2021-12-21 DIAGNOSIS — Z86010 Personal history of colonic polyps: Secondary | ICD-10-CM | POA: Diagnosis not present

## 2021-12-21 DIAGNOSIS — R194 Change in bowel habit: Secondary | ICD-10-CM | POA: Diagnosis not present

## 2021-12-22 DIAGNOSIS — L578 Other skin changes due to chronic exposure to nonionizing radiation: Secondary | ICD-10-CM | POA: Diagnosis not present

## 2021-12-22 DIAGNOSIS — L814 Other melanin hyperpigmentation: Secondary | ICD-10-CM | POA: Diagnosis not present

## 2021-12-22 DIAGNOSIS — L568 Other specified acute skin changes due to ultraviolet radiation: Secondary | ICD-10-CM | POA: Diagnosis not present

## 2021-12-22 DIAGNOSIS — L821 Other seborrheic keratosis: Secondary | ICD-10-CM | POA: Diagnosis not present

## 2021-12-28 ENCOUNTER — Encounter: Payer: Self-pay | Admitting: Plastic Surgery

## 2021-12-28 ENCOUNTER — Encounter: Payer: Self-pay | Admitting: *Deleted

## 2021-12-28 ENCOUNTER — Ambulatory Visit (INDEPENDENT_AMBULATORY_CARE_PROVIDER_SITE_OTHER): Payer: Self-pay | Admitting: Plastic Surgery

## 2021-12-28 DIAGNOSIS — Z719 Counseling, unspecified: Secondary | ICD-10-CM | POA: Insufficient documentation

## 2021-12-28 NOTE — Progress Notes (Signed)
     Patient ID: Christie Fitzpatrick, female    DOB: Feb 03, 1956, 66 y.o.   MRN: [id number]   Chief Complaint  Patient presents with   Consult   Advice Only    The patient is a very nice 66 yrs old female here for evaluation of her face.  She underwent a face lift 7 years ago by Dr. Eugene Garnet.  She was pleased with the results and new that there was a chance she would need or want one within 10 years.  He has retired from Theatre manager.  She is working on the health of her skin and has a treatment regimen at present that includes Tretinoin.  She has some midface volume loss that enhances the volume changes in her lower lids as well.  Mild rhytides of the forehead and face.    Review of Systems  Constitutional: Negative.   HENT: Negative.    Eyes: Negative.   Respiratory: Negative.    Cardiovascular: Negative.   Gastrointestinal: Negative.   Endocrine: Negative.   Genitourinary: Negative.   Musculoskeletal: Negative.   Allergic/Immunologic: Negative.   Neurological: Negative.   Psychiatric/Behavioral: Negative.      History reviewed. No pertinent past medical history.  Past Surgical History:  Procedure Laterality Date   ELBOW SURGERY Right 1978   dislocated elbow with fractured radius   TONSILLECTOMY     removed as a child   TOTAL HIP ARTHROPLASTY Right 05/06/2021   Procedure: Right TOTAL HIP ARTHROPLASTY ANTERIOR APPROACH;  Surgeon: Mcarthur Rossetti, MD;  Location: [place];  Service: Orthopedics;  Laterality: Right;   WISDOM TOOTH EXTRACTION  1973     No current outpatient medications on file.   Objective:   Vitals:   12/28/21 1502  BP: 132/84  Pulse: 63  SpO2: 100%    Physical Exam Vitals reviewed.  Constitutional:      Appearance: Normal appearance.  HENT:     Head: Normocephalic and atraumatic.  Pulmonary:     Effort: Pulmonary effort is normal.  Skin:    Capillary Refill: Capillary refill takes less than 2 seconds.     Coloration: Skin is not jaundiced.      Findings: No bruising or lesion.  Neurological:     Mental Status: She is alert and oriented to person, place, and time.  Psychiatric:        Mood and Affect: Mood normal.        Behavior: Behavior normal.        Thought Content: Thought content normal.        Judgment: Judgment normal.     Assessment & Plan:  Encounter for counseling  We discussed the following: Skin health so that if she goes to a face lift in the future she will have a better result. BBL or IPL Halo of face and neck Filler to cheeks Consult with Lily and possible switch to [employer] Complex retinol.  Barren, DO

## 2022-01-06 DIAGNOSIS — D485 Neoplasm of uncertain behavior of skin: Secondary | ICD-10-CM | POA: Diagnosis not present

## 2022-01-06 DIAGNOSIS — C44319 Basal cell carcinoma of skin of other parts of face: Secondary | ICD-10-CM | POA: Diagnosis not present

## 2022-01-17 DIAGNOSIS — D0461 Carcinoma in situ of skin of right upper limb, including shoulder: Secondary | ICD-10-CM | POA: Diagnosis not present

## 2022-01-17 DIAGNOSIS — C44319 Basal cell carcinoma of skin of other parts of face: Secondary | ICD-10-CM | POA: Diagnosis not present

## 2022-01-19 DIAGNOSIS — D122 Benign neoplasm of ascending colon: Secondary | ICD-10-CM | POA: Diagnosis not present

## 2022-01-19 DIAGNOSIS — Z86010 Personal history of colonic polyps: Secondary | ICD-10-CM | POA: Diagnosis not present

## 2022-01-19 DIAGNOSIS — R194 Change in bowel habit: Secondary | ICD-10-CM | POA: Diagnosis not present

## 2022-02-07 ENCOUNTER — Ambulatory Visit (INDEPENDENT_AMBULATORY_CARE_PROVIDER_SITE_OTHER): Payer: Self-pay

## 2022-02-07 DIAGNOSIS — Z719 Counseling, unspecified: Secondary | ICD-10-CM

## 2022-02-17 DIAGNOSIS — R194 Change in bowel habit: Secondary | ICD-10-CM | POA: Diagnosis not present

## 2022-02-17 DIAGNOSIS — Z86010 Personal history of colonic polyps: Secondary | ICD-10-CM | POA: Diagnosis not present

## 2022-02-17 DIAGNOSIS — K648 Other hemorrhoids: Secondary | ICD-10-CM | POA: Diagnosis not present

## 2022-03-07 ENCOUNTER — Encounter (INDEPENDENT_AMBULATORY_CARE_PROVIDER_SITE_OTHER): Payer: Self-pay

## 2022-03-07 DIAGNOSIS — Z719 Counseling, unspecified: Secondary | ICD-10-CM

## 2022-05-05 ENCOUNTER — Encounter: Payer: Self-pay | Admitting: Radiology

## 2022-06-14 DIAGNOSIS — K644 Residual hemorrhoidal skin tags: Secondary | ICD-10-CM | POA: Diagnosis not present

## 2022-06-17 DIAGNOSIS — H524 Presbyopia: Secondary | ICD-10-CM | POA: Diagnosis not present

## 2022-06-17 DIAGNOSIS — H2513 Age-related nuclear cataract, bilateral: Secondary | ICD-10-CM | POA: Diagnosis not present

## 2022-07-14 DIAGNOSIS — K080 Exfoliation of teeth due to systemic causes: Secondary | ICD-10-CM | POA: Diagnosis not present

## 2022-07-21 DIAGNOSIS — L820 Inflamed seborrheic keratosis: Secondary | ICD-10-CM | POA: Diagnosis not present

## 2022-08-29 DIAGNOSIS — K641 Second degree hemorrhoids: Secondary | ICD-10-CM | POA: Diagnosis not present

## 2022-09-19 DIAGNOSIS — K641 Second degree hemorrhoids: Secondary | ICD-10-CM | POA: Diagnosis not present

## 2022-09-21 DIAGNOSIS — Z136 Encounter for screening for cardiovascular disorders: Secondary | ICD-10-CM | POA: Diagnosis not present

## 2022-09-21 DIAGNOSIS — Z23 Encounter for immunization: Secondary | ICD-10-CM | POA: Diagnosis not present

## 2022-09-21 DIAGNOSIS — R300 Dysuria: Secondary | ICD-10-CM | POA: Diagnosis not present

## 2022-09-21 DIAGNOSIS — Z0000 Encounter for general adult medical examination without abnormal findings: Secondary | ICD-10-CM | POA: Diagnosis not present

## 2022-09-21 DIAGNOSIS — K589 Irritable bowel syndrome without diarrhea: Secondary | ICD-10-CM | POA: Diagnosis not present

## 2022-09-21 DIAGNOSIS — Z131 Encounter for screening for diabetes mellitus: Secondary | ICD-10-CM | POA: Diagnosis not present

## 2022-09-21 DIAGNOSIS — D649 Anemia, unspecified: Secondary | ICD-10-CM | POA: Diagnosis not present

## 2022-09-21 DIAGNOSIS — M8589 Other specified disorders of bone density and structure, multiple sites: Secondary | ICD-10-CM | POA: Diagnosis not present

## 2022-09-29 DIAGNOSIS — E538 Deficiency of other specified B group vitamins: Secondary | ICD-10-CM | POA: Diagnosis not present

## 2022-09-30 DIAGNOSIS — C4491 Basal cell carcinoma of skin, unspecified: Secondary | ICD-10-CM | POA: Diagnosis not present

## 2022-11-21 IMAGING — CR DG PORTABLE PELVIS
1 series · 1 of 1 positions shown · non-contrast
Comparison: Intraoperative fluoroscopic images 05/06/2021. Right
hip radiographs 11/17/2020.

CLINICAL DATA: Status post right hip replacement.

EXAM:
PORTABLE PELVIS 1-2 VIEWS

[[person_name] view]
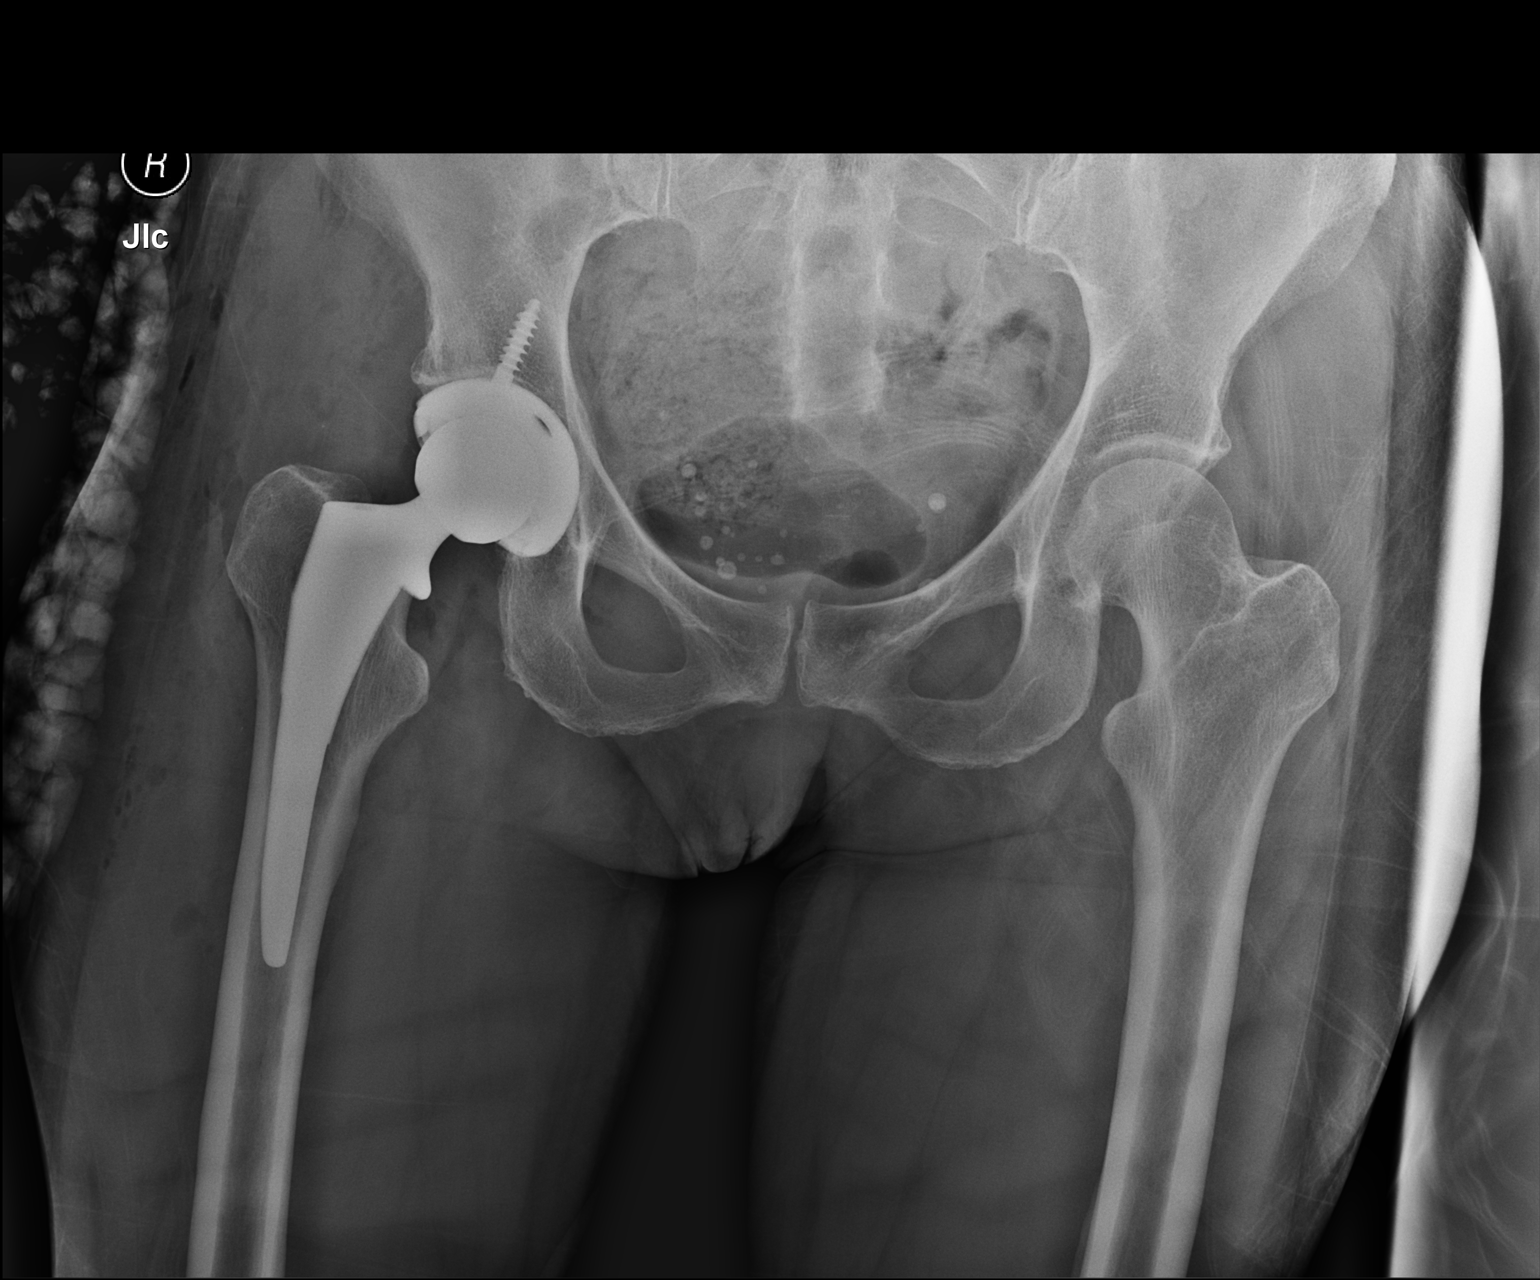

[1 of 1 positions shown; findings below may reference images not displayed]

FINDINGS: Sequelae of right total hip arthroplasty are identified. The
prosthetic components appear normally positioned on this single AP
image. Postoperative gas is noted in the adjacent soft tissues. No
acute fracture is identified. Phleboliths are noted in the pelvis.
IMPRESSION: Right total hip arthroplasty without evidence of acute complication.

## 2022-11-21 IMAGING — RF DG HIP (WITH OR WITHOUT PELVIS) 2-3V*R*
1 series · 5 of 5 positions shown · non-contrast
Comparison: Right hip x-rays dated November 17, 2020.

CLINICAL DATA: Right hip replacement.

EXAM:
DG HIP (WITH OR WITHOUT PELVIS) 2-3V RIGHT

[Series 1: dg x-ray · 0.20mm/px · 5 of 5 slices shown]
[im 1/5]
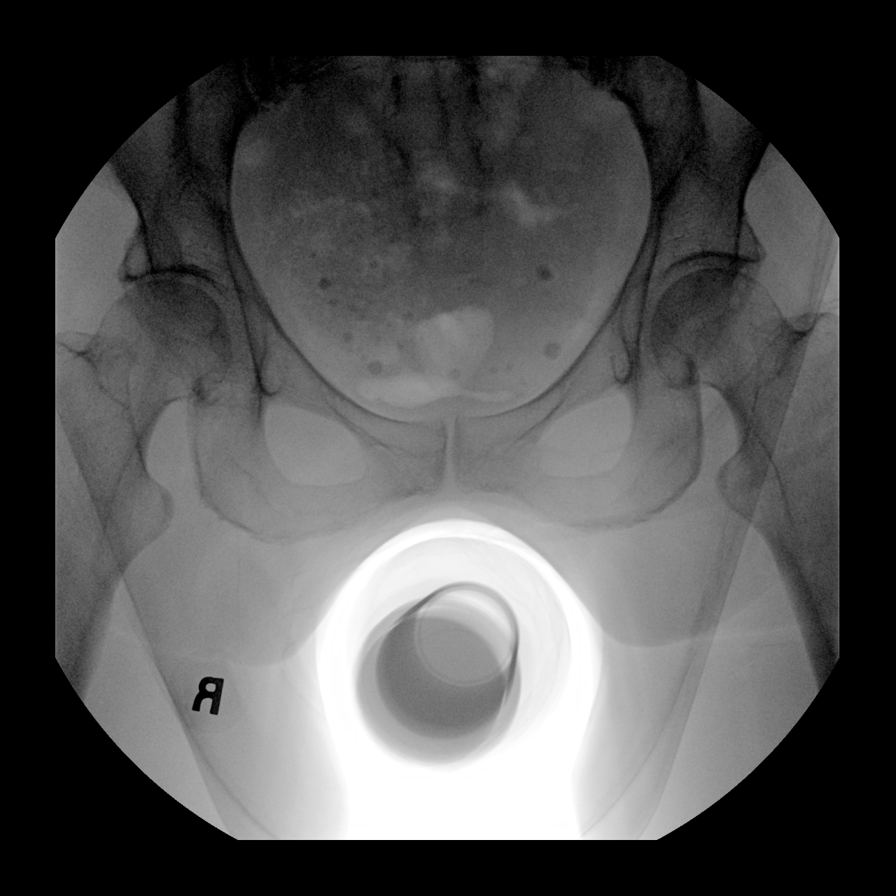
[im 2/5]
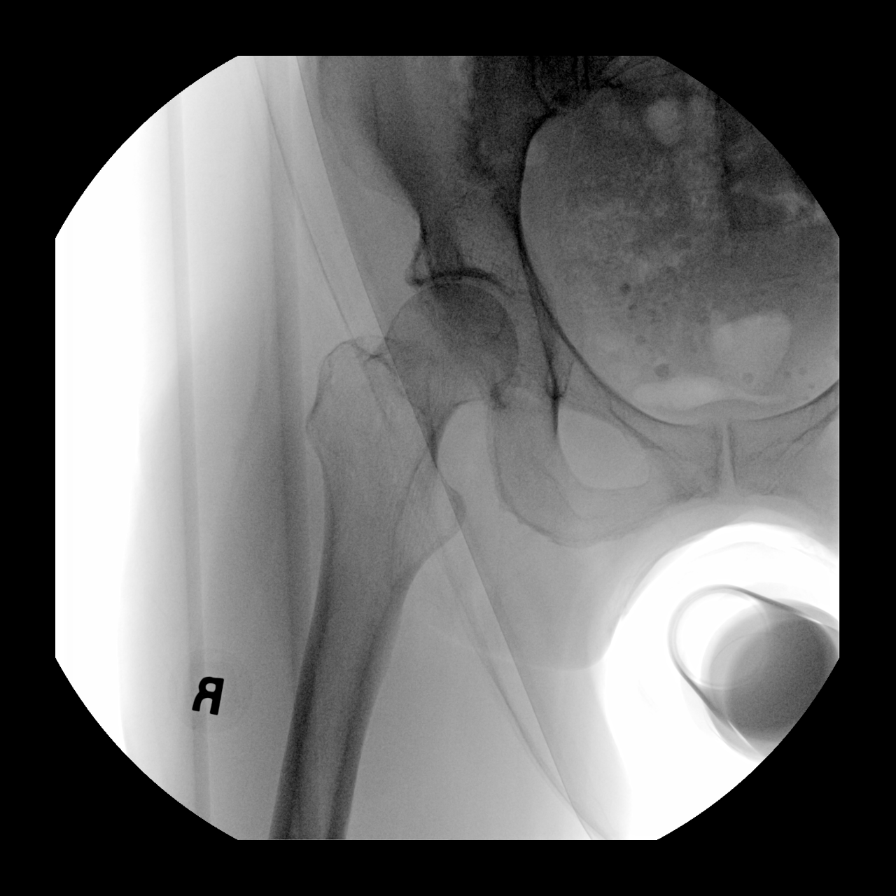
[im 3/5]
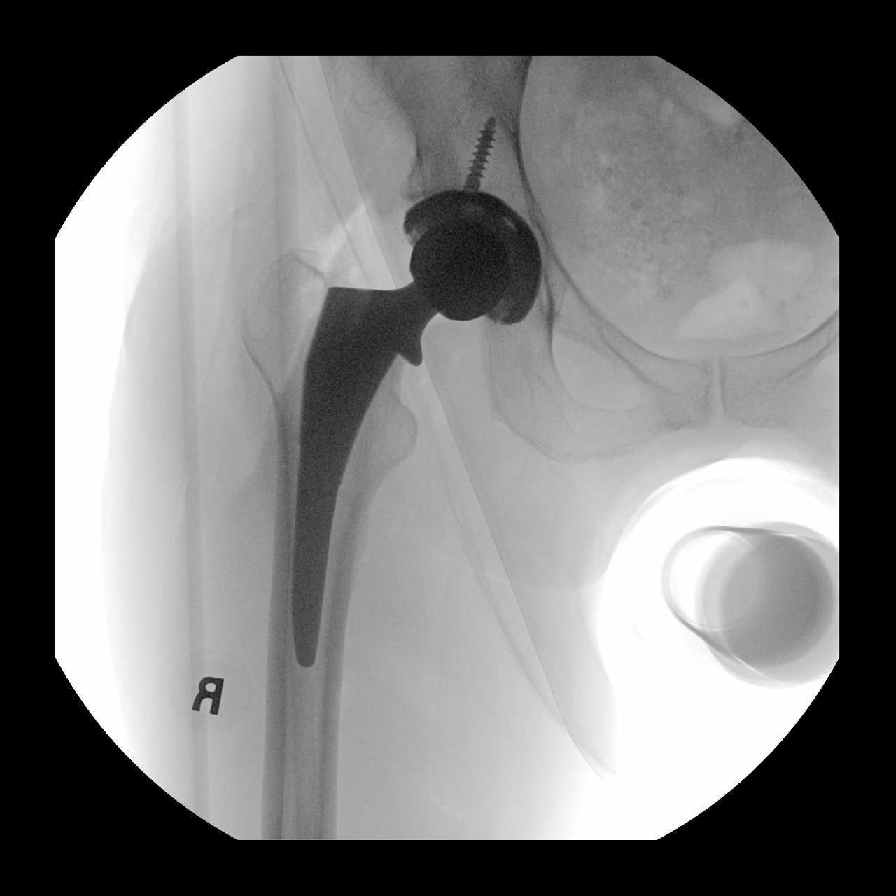
[im 4/5]
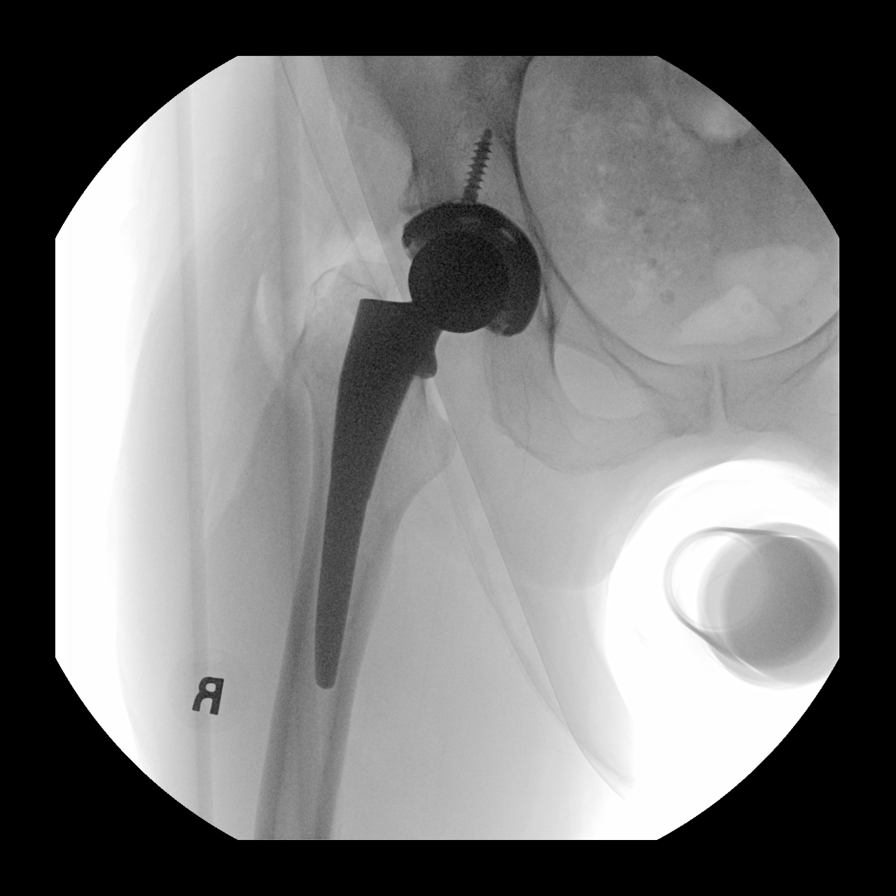
[im 5/5]
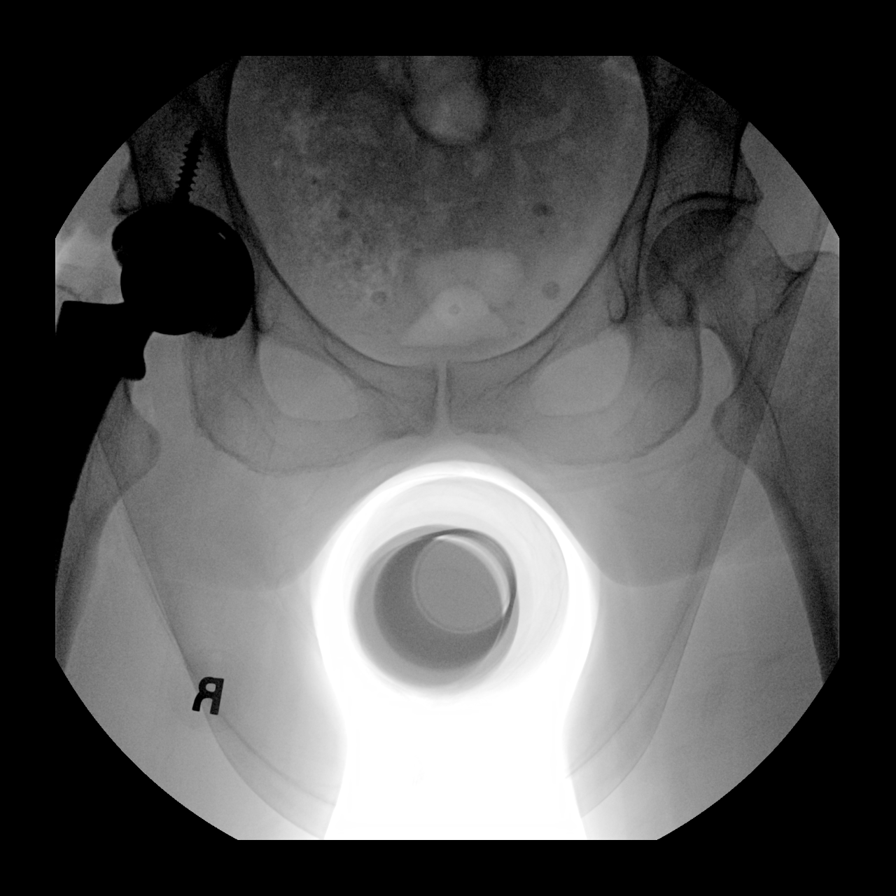

[5 of 5 positions shown; findings below may reference images not displayed]

FLUOROSCOPY TIME:  Radiation Exposure Index (as provided by the
fluoroscopic device): 2.54 mGy Kerma

C-arm fluoroscopic images were obtained intraoperatively and
submitted for post operative interpretation.
FINDINGS: Multiple intraoperative fluoroscopic images demonstrate interval
right total hip arthroplasty. Components are well aligned. No acute
osseous abnormality. Grossly normal left hip.
IMPRESSION: 1. Intraoperative fluoroscopic guidance for right total hip
arthroplasty.

## 2022-11-30 DIAGNOSIS — D485 Neoplasm of uncertain behavior of skin: Secondary | ICD-10-CM | POA: Diagnosis not present

## 2022-11-30 DIAGNOSIS — L578 Other skin changes due to chronic exposure to nonionizing radiation: Secondary | ICD-10-CM | POA: Diagnosis not present

## 2022-11-30 DIAGNOSIS — Z85828 Personal history of other malignant neoplasm of skin: Secondary | ICD-10-CM | POA: Diagnosis not present

## 2022-11-30 DIAGNOSIS — L820 Inflamed seborrheic keratosis: Secondary | ICD-10-CM | POA: Diagnosis not present

## 2022-11-30 DIAGNOSIS — L814 Other melanin hyperpigmentation: Secondary | ICD-10-CM | POA: Diagnosis not present

## 2022-11-30 DIAGNOSIS — C44519 Basal cell carcinoma of skin of other part of trunk: Secondary | ICD-10-CM | POA: Diagnosis not present

## 2022-11-30 DIAGNOSIS — L821 Other seborrheic keratosis: Secondary | ICD-10-CM | POA: Diagnosis not present

## 2022-11-30 DIAGNOSIS — L570 Actinic keratosis: Secondary | ICD-10-CM | POA: Diagnosis not present

## 2022-12-19 ENCOUNTER — Ambulatory Visit: Payer: Medicare Other | Admitting: Orthopaedic Surgery

## 2022-12-19 ENCOUNTER — Other Ambulatory Visit (INDEPENDENT_AMBULATORY_CARE_PROVIDER_SITE_OTHER): Payer: Medicare Other

## 2022-12-19 DIAGNOSIS — Z96641 Presence of right artificial hip joint: Secondary | ICD-10-CM
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
## 2022-12-27 DIAGNOSIS — C44519 Basal cell carcinoma of skin of other part of trunk: Secondary | ICD-10-CM | POA: Diagnosis not present

## 2022-12-27 DIAGNOSIS — L905 Scar conditions and fibrosis of skin: Secondary | ICD-10-CM | POA: Diagnosis not present

## 2023-01-05 DIAGNOSIS — R300 Dysuria: Secondary | ICD-10-CM | POA: Diagnosis not present

## 2023-01-16 DIAGNOSIS — R300 Dysuria: Secondary | ICD-10-CM | POA: Diagnosis not present

## 2023-01-19 DIAGNOSIS — K080 Exfoliation of teeth due to systemic causes: Secondary | ICD-10-CM | POA: Diagnosis not present

## 2023-01-25 DIAGNOSIS — Z23 Encounter for immunization: Secondary | ICD-10-CM | POA: Diagnosis not present

## 2023-01-25 DIAGNOSIS — N952 Postmenopausal atrophic vaginitis: Secondary | ICD-10-CM | POA: Diagnosis not present

## 2023-01-25 DIAGNOSIS — R300 Dysuria: Secondary | ICD-10-CM | POA: Diagnosis not present

## 2023-03-07 DIAGNOSIS — N952 Postmenopausal atrophic vaginitis: Secondary | ICD-10-CM | POA: Diagnosis not present

## 2023-03-07 DIAGNOSIS — Z833 Family history of diabetes mellitus: Secondary | ICD-10-CM | POA: Diagnosis not present

## 2023-03-07 DIAGNOSIS — N390 Urinary tract infection, site not specified: Secondary | ICD-10-CM | POA: Diagnosis not present

## 2023-03-07 DIAGNOSIS — R5383 Other fatigue: Secondary | ICD-10-CM | POA: Diagnosis not present

## 2023-03-08 DIAGNOSIS — N390 Urinary tract infection, site not specified: Secondary | ICD-10-CM | POA: Diagnosis not present

## 2023-03-08 DIAGNOSIS — R5383 Other fatigue: Secondary | ICD-10-CM | POA: Diagnosis not present

## 2023-03-13 DIAGNOSIS — N390 Urinary tract infection, site not specified: Secondary | ICD-10-CM | POA: Diagnosis not present

## 2023-03-13 DIAGNOSIS — N952 Postmenopausal atrophic vaginitis: Secondary | ICD-10-CM | POA: Diagnosis not present

## 2023-03-13 DIAGNOSIS — Z124 Encounter for screening for malignant neoplasm of cervix: Secondary | ICD-10-CM | POA: Diagnosis not present

## 2023-03-13 DIAGNOSIS — Z1272 Encounter for screening for malignant neoplasm of vagina: Secondary | ICD-10-CM | POA: Diagnosis not present

## 2023-03-13 DIAGNOSIS — Z1231 Encounter for screening mammogram for malignant neoplasm of breast: Secondary | ICD-10-CM | POA: Diagnosis not present

## 2023-03-13 DIAGNOSIS — Z01411 Encounter for gynecological examination (general) (routine) with abnormal findings: Secondary | ICD-10-CM | POA: Diagnosis not present

## 2023-05-11 DIAGNOSIS — L111 Transient acantholytic dermatosis [Grover]: Secondary | ICD-10-CM | POA: Diagnosis not present

## 2023-06-01 DIAGNOSIS — L814 Other melanin hyperpigmentation: Secondary | ICD-10-CM | POA: Diagnosis not present

## 2023-06-01 DIAGNOSIS — L821 Other seborrheic keratosis: Secondary | ICD-10-CM | POA: Diagnosis not present

## 2023-06-01 DIAGNOSIS — Z85828 Personal history of other malignant neoplasm of skin: Secondary | ICD-10-CM | POA: Diagnosis not present

## 2023-06-01 DIAGNOSIS — L578 Other skin changes due to chronic exposure to nonionizing radiation: Secondary | ICD-10-CM | POA: Diagnosis not present

## 2023-06-28 ENCOUNTER — Other Ambulatory Visit (HOSPITAL_COMMUNITY): Payer: Self-pay | Admitting: Internal Medicine

## 2023-06-28 DIAGNOSIS — Z800 Family history of malignant neoplasm of digestive organs: Secondary | ICD-10-CM

## 2023-07-13 ENCOUNTER — Ambulatory Visit (HOSPITAL_COMMUNITY): Admission: RE | Admit: 2023-07-13 | Source: Ambulatory Visit

## 2023-07-13 ENCOUNTER — Other Ambulatory Visit (HOSPITAL_COMMUNITY): Payer: Self-pay | Admitting: Internal Medicine

## 2023-07-13 ENCOUNTER — Encounter (HOSPITAL_COMMUNITY): Payer: Self-pay

## 2023-07-13 DIAGNOSIS — Z800 Family history of malignant neoplasm of digestive organs: Secondary | ICD-10-CM

## 2023-07-17 ENCOUNTER — Inpatient Hospital Stay

## 2023-07-17 ENCOUNTER — Encounter: Payer: Self-pay | Admitting: Genetic Counselor

## 2023-07-17 ENCOUNTER — Inpatient Hospital Stay: Attending: Genetic Counselor | Admitting: Genetic Counselor

## 2023-07-17 DIAGNOSIS — Z8049 Family history of malignant neoplasm of other genital organs: Secondary | ICD-10-CM

## 2023-07-17 DIAGNOSIS — Z800 Family history of malignant neoplasm of digestive organs: Secondary | ICD-10-CM | POA: Diagnosis not present

## 2023-07-17 DIAGNOSIS — Z1379 Encounter for other screening for genetic and chromosomal anomalies: Secondary | ICD-10-CM

## 2023-07-17 NOTE — Progress Notes (Signed)
 REFERRING PROVIDER: Tena Feeling, MD [address] [place],  [place] [address]  PRIMARY PROVIDER:  Berta Brittle, MD (Inactive)  PRIMARY REASON FOR VISIT:  Encounter Diagnoses  Name Primary?   Family history of pancreatic cancer Yes   Family history of cancer of extrahepatic bile ducts    Family history of uterine cancer     HISTORY OF PRESENT ILLNESS:   Ms. Christie Fitzpatrick, a 68 y.o. female, was seen for a [facility] cancer genetics consultation at the request of Dr. Candi Chafe  due to a family history of pancreatic cancer.  Christie Fitzpatrick presents to clinic today to discuss the possibility of a hereditary predisposition to cancer, to discuss genetic testing, and to further clarify her future cancer risks, as well as potential cancer risks for family members.   Christie Fitzpatrick has a personal history of basal cell carcinoma and squamous cell carcinoma of the skin.    SCREENING/RISK FACTORS:  Mammogram within the last year: yes Number of breast biopsies: 0. Colonoscopy: yes; less than 10 lifetime polyps;. Hysterectomy: no.  Ovaries intact: yes.  Menarche was at age 28.  First live birth at age 55.  Menopausal status: postmenopausal in late 65s.  OCP use for approximately 0 years.  No systemic HRT use.  Dermatology screening: yes; every 6 months   Blood transfusion within past 4 weeks: no   Past Surgical History:  Procedure Laterality Date   ELBOW SURGERY Right 1978   dislocated elbow with fractured radius   TONSILLECTOMY     removed as a child   TOTAL HIP ARTHROPLASTY Right 05/06/2021   Procedure: Right TOTAL HIP ARTHROPLASTY ANTERIOR APPROACH;  Surgeon: Arnie Lao, MD;  Location: [facility] OR;  Service: Orthopedics;  Laterality: Right;   WISDOM TOOTH EXTRACTION  1973     FAMILY HISTORY:  We obtained a detailed, 4-generation family history.  Significant diagnoses are listed below: Family History  Problem Relation Age of Onset   Squamous cell carcinoma Mother         Multiple SCC and BCC throughout life. Metastasis in her early-80s.   Other Father 5       Bile duct cancer in his early-60s. Metastasized to other parts of GI tract.   Skin cancer Sister        Multiple SCC and BCC.   Skin cancer Brother        Multple SCC and BCC.   Uterine cancer Maternal Aunt 25       d.25     Christie Fitzpatrick is unaware of previous family history of genetic testing for hereditary cancer risks. Other relatives are unavailable for genetic testing at this time.   There is no reported Ashkenazi Jewish ancestry. There is no known consanguinity.  GENETIC COUNSELING ASSESSMENT: [name] is a 68 y.o. female with a family history which is somewhat suggestive of a hereditary cancer syndrome and predisposition to cancer given the presence of pancreatic cancer and related cancers in the family (uterine, bile duct, etc). We, therefore, discussed and recommended the following at today's visit.   DISCUSSION: We discussed that 5 - 10% of cancer is hereditary.  Hereditary pancreatic cancer can be associated with mutations in genes including but not limited to ATM, CDKN2A, and BRCA2.  There are other genes that can be associated with hereditary pancreatic, uterine, or bile duct cancer syndromes.  We discussed that testing is beneficial for several reasons, including knowing about other cancer risks, identifying potential screening and risk-reduction options  that may be appropriate, and understanding if other family members could be at an increased risk for cancer and allowing them to undergo genetic testing.  We reviewed the characteristics, features and inheritance patterns of hereditary cancer syndromes. We also discussed genetic testing, including the appropriate family members to test, the process of testing, insurance coverage and turn-around-time for results. We discussed the implications of a negative, positive, and variant of uncertain significant result. We discussed that negative  results would be uninformative given that Ms. Fromer does not have a personal history of cancer. We recommended [name] pursue genetic testing for a panel that contains genes associated with pancreatic cancer, bile duct cancer, uterine cancer, and other cancers.  Based on [name]'s family history of pancreatic cancer in her brother, who did not have any known genetic testing before he passed away, she meets NCCN medical criteria for genetic testing.  She is the most informative relative relative available for genetic testing. Despite that she meets criteria, she may still have an out of pocket cost. We discussed that if her out of pocket cost for testing is over $100, the laboratory should contact them to discuss self-pay options and/or patient pay assistance programs.   We discussed the Genetic Information Non-Discrimination Act (GINA) of 2008, which helps protect individuals against genetic discrimination based on their genetic test results.  It impacts both health insurance and employment.  With health insurance, it protects against genetic test results being used for increased premiums or policy termination. For employment, it protects against hiring, firing and promoting decisions based on genetic test results.  GINA does not apply to those in the [employer], those who work for companies with less than 15 employees, and new life insurance or long-term disability insurance policies.  Health status due to a cancer diagnosis is not protected under GINA.  PLAN: [name] did not wish to pursue genetic testing at today's visit due to GINA considerations. We understand this decision and remain available to coordinate genetic testing at any time in the future. We, therefore, recommend [name] continue to follow the cancer screening guidelines given by her primary healthcare provider.  Based on Ms. Ken's family history, we recommended first degree relatives of her brother, who was diagnosed  with pancreatic cancer, have genetic counseling and testing. [name] will let us  know if we can be of any assistance in coordinating genetic counseling and/or testing for appropriate family members.   Lastly, we encouraged Ms. Matsushima to remain in contact with cancer genetics annually so that we can continuously update the family history and inform her of any changes in cancer genetics and testing that may be of benefit for this family.   Ms. Swor questions were answered to her satisfaction today. Our contact information was provided should additional questions or concerns arise. Thank you for the referral and allowing us  to share in the care of your patient.   Alizee Maple M. Ora Billing, MS, [facility] Genetic Counselor [email or web address] (P) [phone]    40 minutes were spent on the date of the encounter in service to the patient including preparation, face-to-face consultation, documentation and care coordination.  The patient was seen alone.  Drs. Iruku, Gudena and/or Maryalice Smaller were available to discuss this case as needed.   _______________________________________________________________________ For Office Staff:  Number of people involved in session: 1 Was an Intern/ student involved with case: no

## 2023-07-20 DIAGNOSIS — K080 Exfoliation of teeth due to systemic causes: Secondary | ICD-10-CM | POA: Diagnosis not present

## 2023-07-26 DIAGNOSIS — L918 Other hypertrophic disorders of the skin: Secondary | ICD-10-CM | POA: Diagnosis not present

## 2023-07-26 DIAGNOSIS — L119 Acantholytic disorder, unspecified: Secondary | ICD-10-CM | POA: Diagnosis not present

## 2023-07-26 DIAGNOSIS — L309 Dermatitis, unspecified: Secondary | ICD-10-CM | POA: Diagnosis not present

## 2023-09-05 ENCOUNTER — Ambulatory Visit (HOSPITAL_COMMUNITY)
Admission: RE | Admit: 2023-09-05 | Discharge: 2023-09-05 | Disposition: A | Discharge status: Home / Self Care | Source: Ambulatory Visit | Attending: Internal Medicine | Admitting: Internal Medicine

## 2023-09-05 DIAGNOSIS — Z800 Family history of malignant neoplasm of digestive organs: Secondary | ICD-10-CM | POA: Insufficient documentation

## 2023-09-05 DIAGNOSIS — Z1289 Encounter for screening for malignant neoplasm of other sites: Secondary | ICD-10-CM | POA: Insufficient documentation

## 2023-09-05 MED ORDER — GADOBUTROL 1 MMOL/ML IV SOLN
5.0000 mL | Freq: Once | INTRAVENOUS | Status: AC | PRN
  Administered 2023-09-05: 5 mL via INTRAVENOUS

## 2023-10-17 DIAGNOSIS — L738 Other specified follicular disorders: Secondary | ICD-10-CM | POA: Diagnosis not present

## 2023-10-17 DIAGNOSIS — Z85828 Personal history of other malignant neoplasm of skin: Secondary | ICD-10-CM | POA: Diagnosis not present

## 2023-10-17 DIAGNOSIS — C44519 Basal cell carcinoma of skin of other part of trunk: Secondary | ICD-10-CM | POA: Diagnosis not present

## 2023-10-17 DIAGNOSIS — L570 Actinic keratosis: Secondary | ICD-10-CM | POA: Diagnosis not present

## 2023-10-17 DIAGNOSIS — L814 Other melanin hyperpigmentation: Secondary | ICD-10-CM | POA: Diagnosis not present

## 2023-10-17 DIAGNOSIS — L111 Transient acantholytic dermatosis [Grover]: Secondary | ICD-10-CM | POA: Diagnosis not present

## 2023-10-26 DIAGNOSIS — K589 Irritable bowel syndrome without diarrhea: Secondary | ICD-10-CM | POA: Diagnosis not present

## 2023-10-26 DIAGNOSIS — E785 Hyperlipidemia, unspecified: Secondary | ICD-10-CM | POA: Diagnosis not present

## 2023-10-26 DIAGNOSIS — Z0000 Encounter for general adult medical examination without abnormal findings: Secondary | ICD-10-CM | POA: Diagnosis not present

## 2023-10-26 DIAGNOSIS — M8589 Other specified disorders of bone density and structure, multiple sites: Secondary | ICD-10-CM | POA: Diagnosis not present

## 2023-10-26 DIAGNOSIS — E538 Deficiency of other specified B group vitamins: Secondary | ICD-10-CM | POA: Diagnosis not present

## 2023-10-26 DIAGNOSIS — M8588 Other specified disorders of bone density and structure, other site: Secondary | ICD-10-CM | POA: Diagnosis not present

## 2023-11-02 DIAGNOSIS — Z23 Encounter for immunization: Secondary | ICD-10-CM | POA: Diagnosis not present

## 2023-12-20 DIAGNOSIS — R059 Cough, unspecified: Secondary | ICD-10-CM | POA: Diagnosis not present

## 2024-01-08 DIAGNOSIS — C44519 Basal cell carcinoma of skin of other part of trunk: Secondary | ICD-10-CM | POA: Diagnosis not present

## 2024-01-24 DIAGNOSIS — E538 Deficiency of other specified B group vitamins: Secondary | ICD-10-CM | POA: Diagnosis not present
